# Patient Record
Sex: Female | Born: 1965 | Race: White | Hispanic: No | Marital: Married | Smoking: Never smoker
Health system: Southern US, Community
[De-identification: ages and names within clinical notes are randomized; demographics above are authoritative.]

## PROBLEM LIST (undated history)

## (undated) DIAGNOSIS — J45909 Unspecified asthma, uncomplicated: Secondary | ICD-10-CM

## (undated) DIAGNOSIS — L509 Urticaria, unspecified: Secondary | ICD-10-CM

## (undated) HISTORY — PX: TONSILLECTOMY: SUR1361

## (undated) HISTORY — DX: Unspecified asthma, uncomplicated: J45.909

## (undated) HISTORY — PX: ADENOIDECTOMY: SUR15

## (undated) HISTORY — PX: BREAST REDUCTION SURGERY: SHX8

## (undated) HISTORY — PX: LIPOSUCTION: SHX10

## (undated) HISTORY — DX: Urticaria, unspecified: L50.9

## (undated) HISTORY — PX: UMBILICAL HERNIA REPAIR: SHX196

---

## 1999-05-04 ENCOUNTER — Inpatient Hospital Stay (HOSPITAL_COMMUNITY): Admission: AD | Admit: 1999-05-04 | Discharge: 1999-05-07 | Payer: Self-pay | Admitting: Obstetrics and Gynecology

## 2000-12-29 ENCOUNTER — Other Ambulatory Visit: Admission: RE | Admit: 2000-12-29 | Discharge: 2000-12-29 | Payer: Self-pay | Admitting: Obstetrics and Gynecology

## 2001-01-08 ENCOUNTER — Ambulatory Visit (HOSPITAL_COMMUNITY): Admission: RE | Admit: 2001-01-08 | Discharge: 2001-01-08 | Payer: Self-pay | Admitting: Obstetrics and Gynecology

## 2001-01-08 ENCOUNTER — Encounter: Payer: Self-pay | Admitting: Obstetrics and Gynecology

## 2002-01-21 ENCOUNTER — Other Ambulatory Visit: Admission: RE | Admit: 2002-01-21 | Discharge: 2002-01-21 | Payer: Self-pay | Admitting: Obstetrics and Gynecology

## 2003-02-07 ENCOUNTER — Other Ambulatory Visit: Admission: RE | Admit: 2003-02-07 | Discharge: 2003-02-07 | Payer: Self-pay | Admitting: Obstetrics and Gynecology

## 2004-03-21 ENCOUNTER — Other Ambulatory Visit: Admission: RE | Admit: 2004-03-21 | Discharge: 2004-03-21 | Payer: Self-pay | Admitting: Obstetrics and Gynecology

## 2005-04-11 ENCOUNTER — Other Ambulatory Visit: Admission: RE | Admit: 2005-04-11 | Discharge: 2005-04-11 | Payer: Self-pay | Admitting: Obstetrics and Gynecology

## 2006-01-19 ENCOUNTER — Emergency Department (HOSPITAL_COMMUNITY): Admission: EM | Admit: 2006-01-19 | Discharge: 2006-01-19 | Payer: Self-pay | Admitting: Emergency Medicine

## 2006-01-28 ENCOUNTER — Ambulatory Visit: Payer: Self-pay | Admitting: Hematology & Oncology

## 2006-01-28 ENCOUNTER — Inpatient Hospital Stay (HOSPITAL_COMMUNITY): Admission: AD | Admit: 2006-01-28 | Discharge: 2006-01-30 | Payer: Self-pay | Admitting: Cardiology

## 2006-01-31 ENCOUNTER — Ambulatory Visit: Payer: Self-pay | Admitting: Hematology & Oncology

## 2006-02-03 LAB — PROTIME-INR
INR: 2.5 (ref 2.00–3.50)
Protime: 30 Seconds — ABNORMAL HIGH (ref 10.6–13.4)

## 2006-02-10 LAB — PROTIME-INR
INR: 3.1 (ref 2.00–3.50)
Protime: 37.2 Seconds — ABNORMAL HIGH (ref 10.6–13.4)

## 2006-02-17 LAB — PROTIME-INR
INR: 4.5 — ABNORMAL HIGH (ref 2.00–3.50)
Protime: 54 Seconds — ABNORMAL HIGH (ref 10.6–13.4)

## 2006-02-24 LAB — PROTIME-INR
INR: 1.6 — ABNORMAL LOW (ref 2.00–3.50)
Protime: 19.2 Seconds — ABNORMAL HIGH (ref 10.6–13.4)

## 2006-03-03 LAB — CBC WITH DIFFERENTIAL/PLATELET
Basophils Absolute: 0.1 10*3/uL (ref 0.0–0.1)
Eosinophils Absolute: 0.2 10*3/uL (ref 0.0–0.5)
HCT: 38.7 % (ref 34.8–46.6)
HGB: 13.8 g/dL (ref 11.6–15.9)
LYMPH%: 39.6 % (ref 14.0–48.0)
MCHC: 35.6 g/dL (ref 32.0–36.0)
MONO#: 0.5 10*3/uL (ref 0.1–0.9)
NEUT#: 3.1 10*3/uL (ref 1.5–6.5)
NEUT%: 48 % (ref 39.6–76.8)
Platelets: 247 10*3/uL (ref 145–400)
WBC: 6.4 10*3/uL (ref 3.9–10.0)

## 2006-03-03 LAB — PROTIME-INR

## 2006-03-13 LAB — PROTIME-INR
INR: 2.7 (ref 2.00–3.50)
Protime: 32.4 Seconds — ABNORMAL HIGH (ref 10.6–13.4)

## 2006-03-19 ENCOUNTER — Ambulatory Visit: Payer: Self-pay | Admitting: Hematology & Oncology

## 2006-03-24 LAB — PROTIME-INR
INR: 2 (ref 2.00–3.50)
Protime: 24 Seconds — ABNORMAL HIGH (ref 10.6–13.4)

## 2006-03-31 LAB — PROTIME-INR
INR: 1.9 — ABNORMAL LOW (ref 2.00–3.50)
Protime: 22.8 Seconds — ABNORMAL HIGH (ref 10.6–13.4)

## 2006-04-14 LAB — PROTIME-INR

## 2006-04-21 LAB — PROTIME-INR: Protime: 30 Seconds — ABNORMAL HIGH (ref 10.6–13.4)

## 2006-04-29 LAB — PROTIME-INR
INR: 2 (ref 2.00–3.50)
Protime: 24 Seconds — ABNORMAL HIGH (ref 10.6–13.4)

## 2006-04-30 ENCOUNTER — Ambulatory Visit: Payer: Self-pay | Admitting: Hematology & Oncology

## 2006-05-02 LAB — CBC WITH DIFFERENTIAL/PLATELET
Basophils Absolute: 0 10*3/uL (ref 0.0–0.1)
EOS%: 7.3 % — ABNORMAL HIGH (ref 0.0–7.0)
Eosinophils Absolute: 0.4 10*3/uL (ref 0.0–0.5)
HGB: 13.8 g/dL (ref 11.6–15.9)
MCH: 31.9 pg (ref 26.0–34.0)
NEUT#: 2.2 10*3/uL (ref 1.5–6.5)
RBC: 4.31 10*6/uL (ref 3.70–5.32)
RDW: 10.2 % — ABNORMAL LOW (ref 11.3–14.5)
lymph#: 2.2 10*3/uL (ref 0.9–3.3)

## 2006-05-02 LAB — PROTIME-INR
INR: 2 (ref 2.00–3.50)
Protime: 24 Seconds — ABNORMAL HIGH (ref 10.6–13.4)

## 2006-05-06 ENCOUNTER — Ambulatory Visit: Payer: Self-pay | Admitting: Vascular Surgery

## 2006-05-06 ENCOUNTER — Ambulatory Visit: Admission: RE | Admit: 2006-05-06 | Discharge: 2006-05-06 | Payer: Self-pay | Admitting: Hematology & Oncology

## 2006-05-12 LAB — PROTIME-INR

## 2006-05-26 LAB — PROTIME-INR
INR: 2.2 (ref 2.00–3.50)
Protime: 26.4 Seconds — ABNORMAL HIGH (ref 10.6–13.4)

## 2006-05-29 LAB — PROTIME-INR: Protime: 25.2 Seconds — ABNORMAL HIGH (ref 10.6–13.4)

## 2006-06-09 LAB — CBC WITH DIFFERENTIAL/PLATELET
BASO%: 0.7 % (ref 0.0–2.0)
EOS%: 3.8 % (ref 0.0–7.0)
LYMPH%: 24.7 % (ref 14.0–48.0)
MCHC: 35.3 g/dL (ref 32.0–36.0)
MCV: 88.8 fL (ref 81.0–101.0)
MONO%: 5.7 % (ref 0.0–13.0)
Platelets: 247 10*3/uL (ref 145–400)
RBC: 4.37 10*6/uL (ref 3.70–5.32)
WBC: 7.4 10*3/uL (ref 3.9–10.0)

## 2006-06-09 LAB — PROTIME-INR: Protime: 44.4 Seconds — ABNORMAL HIGH (ref 10.6–13.4)

## 2006-06-16 ENCOUNTER — Ambulatory Visit: Payer: Self-pay | Admitting: Hematology & Oncology

## 2006-06-23 LAB — PROTIME-INR: INR: 2.1 (ref 2.00–3.50)

## 2006-07-01 LAB — PROTIME-INR
INR: 2.4 (ref 2.00–3.50)
Protime: 28.8 Seconds — ABNORMAL HIGH (ref 10.6–13.4)

## 2006-07-07 LAB — PROTIME-INR
INR: 2.3 (ref 2.00–3.50)
Protime: 27.6 Seconds — ABNORMAL HIGH (ref 10.6–13.4)

## 2006-07-21 LAB — PROTIME-INR

## 2006-07-28 LAB — PROTIME-INR
INR: 2.6 (ref 2.00–3.50)
Protime: 31.2 Seconds — ABNORMAL HIGH (ref 10.6–13.4)

## 2006-08-07 ENCOUNTER — Ambulatory Visit: Payer: Self-pay | Admitting: Hematology & Oncology

## 2006-08-11 LAB — PROTIME-INR
INR: 2.8 (ref 2.00–3.50)
Protime: 33.6 Seconds — ABNORMAL HIGH (ref 10.6–13.4)

## 2006-08-25 LAB — PROTIME-INR
INR: 2.6 (ref 2.00–3.50)
Protime: 31.2 Seconds — ABNORMAL HIGH (ref 10.6–13.4)

## 2006-09-08 LAB — PROTIME-INR: INR: 2.8 (ref 2.00–3.50)

## 2006-09-18 ENCOUNTER — Ambulatory Visit: Payer: Self-pay | Admitting: Hematology & Oncology

## 2006-09-22 LAB — CBC WITH DIFFERENTIAL/PLATELET
BASO%: 0.9 % (ref 0.0–2.0)
Basophils Absolute: 0.1 10*3/uL (ref 0.0–0.1)
EOS%: 3.2 % (ref 0.0–7.0)
HCT: 41.1 % (ref 34.8–46.6)
HGB: 14.8 g/dL (ref 11.6–15.9)
MCH: 32.8 pg (ref 26.0–34.0)
MCHC: 36 g/dL (ref 32.0–36.0)
MCV: 91.3 fL (ref 81.0–101.0)
MONO%: 7 % (ref 0.0–13.0)
NEUT%: 45.5 % (ref 39.6–76.8)
RDW: 12.8 % (ref 11.3–14.5)

## 2006-10-13 LAB — PROTIME-INR

## 2006-11-02 ENCOUNTER — Ambulatory Visit: Payer: Self-pay | Admitting: Hematology & Oncology

## 2006-11-03 LAB — PROTIME-INR: Protime: 50.4 Seconds — ABNORMAL HIGH (ref 10.6–13.4)

## 2006-11-24 LAB — CBC WITH DIFFERENTIAL/PLATELET
BASO%: 0.6 % (ref 0.0–2.0)
Eosinophils Absolute: 0.2 10*3/uL (ref 0.0–0.5)
LYMPH%: 36.1 % (ref 14.0–48.0)
MCHC: 35.4 g/dL (ref 32.0–36.0)
MONO#: 0.4 10*3/uL (ref 0.1–0.9)
NEUT#: 3.4 10*3/uL (ref 1.5–6.5)
RBC: 4.48 10*6/uL (ref 3.70–5.32)
RDW: 10.3 % — ABNORMAL LOW (ref 11.3–14.5)
WBC: 6.2 10*3/uL (ref 3.9–10.0)
lymph#: 2.3 10*3/uL (ref 0.9–3.3)

## 2006-11-24 LAB — PROTIME-INR
INR: 3 (ref 2.00–3.50)
Protime: 36 Seconds — ABNORMAL HIGH (ref 10.6–13.4)

## 2006-12-19 ENCOUNTER — Ambulatory Visit: Payer: Self-pay | Admitting: Hematology & Oncology

## 2006-12-24 LAB — PROTIME-INR
INR: 3.1 (ref 2.00–3.50)
Protime: 37.2 Seconds — ABNORMAL HIGH (ref 10.6–13.4)

## 2007-01-26 LAB — CBC WITH DIFFERENTIAL/PLATELET
EOS%: 4.2 % (ref 0.0–7.0)
Eosinophils Absolute: 0.2 10*3/uL (ref 0.0–0.5)
LYMPH%: 39.6 % (ref 14.0–48.0)
MCH: 33.1 pg (ref 26.0–34.0)
MCV: 91.1 fL (ref 81.0–101.0)
MONO%: 6.3 % (ref 0.0–13.0)
NEUT#: 2.2 10*3/uL (ref 1.5–6.5)
Platelets: 230 10*3/uL (ref 145–400)
RBC: 4.16 10*6/uL (ref 3.70–5.32)
RDW: 12.4 % (ref 11.3–14.5)

## 2007-01-26 LAB — PROTIME-INR: Protime: 33.6 Seconds — ABNORMAL HIGH (ref 10.6–13.4)

## 2007-03-19 ENCOUNTER — Ambulatory Visit: Payer: Self-pay | Admitting: Hematology & Oncology

## 2010-07-27 NOTE — Consult Note (Signed)
[name], [name] NO.:  [id number]   MEDICAL RECORD NO.:  [id number]          PATIENT TYPE:  INP   LOCATION:  5711                         FACILITY:  [facility]   PHYSICIAN:  Stephani Police, PA    DATE OF BIRTH:  12/02/1965   DATE OF CONSULTATION:  DATE OF DISCHARGE:                                   CONSULTATION   HISTORY OF PRESENT ILLNESS:  Patient was admitted on January 27, 2006 and  had suffered a recent DVT on January 19, 2006.  She had been unable to  obtain therapeutic INR on Coumadin and was having pain in her lower  extremity.  LFTs on admission were AST 109 and ALT 134.  Patient currently  feels very well.  She denies abdominal pain, denies nausea, vomiting,  diarrhea, denies jaundice, rash, itch.  She denies any history of tattoo,  blood transfusion, or IV drug use.  She denies multiple sexual partners.  She denies any swelling or ascites, denies recent illness, denies recent  weight loss, denies any discoloration of her urine or stool.  She does tell  me that she recently stopped taking birth control pills that she has been on  for the past 15 years.   PAST MEDICAL HISTORY:  She reports that she has irritable bowel syndrome but  it is well controlled with diet, exercise, and stress management, GERD,  controlled with Nexium for the past 2-3 years, asthma, allergies.  She has a  history of palpitations.  She has an anal fissure and occasionally sees  bright red blood per rectum.  There is a minimal amount of streaking on the  toilet tissue when she notices it.  She had a lower extremity sprain 2-3  weeks ago.   SURGERIES:  Include an umbilical hernia repair with liposuction in May of  2007, tonsillectomy, C-section x2, breast reduction.   FAMILY HISTORY:  Is negative for GI cancers or liver disease.   SOCIAL HISTORY:  Shows no tobacco, no drugs, and approximately 1 glass of  wine per week.   REVIEW OF SYSTEMS:  Positive for headache, which the  patient attributes to  her sinus and allergies.   MEDICATIONS:  She was recently on birth control pills; Yaz specifically.  She was also on Mobic recently, but both of those have been discontinued.  Currently on Nexium, Coumadin, and Nasonex.   PHYSICAL EXAM:  GENERAL:  She is alert, oriented, in no apparent distress.  She is very pleasant to speak with.  CARDIOVASCULAR SYSTEM:  Has a regular rate and rhythm with no murmurs, rubs,  or gallops.  LUNGS:  Clear to auscultation bilaterally.  ABDOMEN:  Soft, nontender, nondistended with positive bowel sounds and no  apparent organomegaly.  SKIN:  Shows no rash or jaundice.  Her eyes have no scleral icterus.  EXTREMITIES:  Show no edema.   ASSESSMENT:  This patient was seen and examined by Dr. Wandalee Ferdinand.  His  assessment is that she is a 45 year old generally healthy female on DVT  (deep vein thrombosis) protocol.  There is no obvious cause for elevated  LFTs.  His recommendations are to check a hepatitis profile, check serum  iron and % saturations to look for hemochromatosis, check a ceruloplasmin  for Wilson's, have pharmacy check meds to see if any that she has been on  may cause an elevation in her LFTs.      Stephani Police, PA     MLY/MEDQ  D:  01/28/2006  T:  01/29/2006  Job:  [id number]   cc:   Ariana [name], M.D.  Ariana [name], M.D.

## 2010-07-27 NOTE — Op Note (Signed)
[facility]  Patient:    Ariana [name], Ariana [name]                    MRN: [id number] Proc. Date: 05/04/99 Adm. Date:  [id number] Attending:  Frederich Balding                           Operative Report  PREOPERATIVE DIAGNOSES:       Intrauterine pregnancy at term with prior cesarean section, desirous of repeat.  POSTOPERATIVE DIAGNOSES:      Intrauterine pregnancy at term with prior cesarean section, desirous of repeat.  OPERATIVE PROCEDURE:          Repeat low transverse cesarean section.  SURGEON:                      Juluis Mire, M.D.  ANESTHESIA:                   Spinal.  ESTIMATED BLOOD LOSS:         800 cc.  PACKS AND DRAINS:             None.  INTRAOPERATIVE BLOOD REPLACEMENT:  None.  COMPLICATIONS:                None.  INDICATIONS:                  Indications are as dictated in history and physical.  DESCRIPTION OF PROCEDURE:     Patient was taken to the OR and placed in supine position with left lateral tilt.  After satisfactory level of spinal anesthesia was obtained, the abdomen was prepped out with Betadine and draped as a sterile field. The prior low transverse skin incision was identified and excised.  The incision was extended through the subcutaneous tissue.  The anterior rectus fascia was entered sharply and incision in the fascia extended laterally.  Fascia was taken off the muscles superiorly and inferiorly.  Rectus muscles were separated in the midline.  The peritoneum was entered sharply and incision in the peritoneum extended both superiorly and inferiorly.  No adhesive process was noted.  A low  transverse bladder flap was developed.  A low transverse uterine incision was begun with a knife and extended laterally using manual traction.  Amniotic fluid was clear.  The infant presented in a vertex presentation and delivered with elevation of the head and fundal pressure; the infant was a viable female who  weighed 7 pounds 7 ounces.  Apgars were 8/9.  Umbilical artery pH was 7.32.  Placenta was then delivered manually.  The uterus was wiped free of remaining membranes and placenta. Uterus was closed with a running interlocking suture of 0 chromic using a two-layer-closure technique; we had good hemostasis.  Tubes and ovaries were visualized and noted to be unremarkable.  Pelvic cavity was irrigated; hemostasis was excellent.  Urine output clear.  Muscles were reapproximated with a running  suture of 3-0 Vicryl, fascia closed with a running suture of 0 PDS and skin was  closed with staples and Steri-Strips.  Sponge, instrument and needle count was reported as correct by the circulating nurse x 2.  Foley catheter remained clear at the time of closure.  Patient did tolerate the procedure well and was returned o the recovery room in good condition. DD:  05/04/99 TD:  05/05/99 Job: [id number]

## 2010-07-27 NOTE — Discharge Summary (Signed)
Ariana [name], MENTEL NO.:  [id number]   MEDICAL RECORD NO.:  [id number]          PATIENT TYPE:  INP   LOCATION:  5711                         FACILITY:  [facility]   PHYSICIAN:  Armanda Magic, M.D.     DATE OF BIRTH:  04/09/1965   DATE OF ADMISSION:  01/27/2006  DATE OF DISCHARGE:  01/30/2006                               DISCHARGE SUMMARY   DISCHARGE DIAGNOSES:  1. Extensive left lower extremity deep venous thrombosis extending      into the popliteal vein.  2. Longterm Coumadin therapy.  3. Transient elevation of liver function tests, idiopathic at this      time.  4. Gastroesophageal reflux disease.  5. Asthma.  6. Allergies.   Ariana [name] is a 45 year old female who was diagnosed with a left calf  DVT several days prior to her admission.  She was treated with b.i.d.  Lovenox and Coumadin.  She was only given 5 doses total of Lovenox and  then was told to follow up for a repeat INR.  She began having  increasing leg pain after 3 days to the point where she could not stand  and presented to our office on January 23, 2006.  Her INR was 1.4.  She  was then placed on Lovenox b.i.d., as well as Coumadin.  She represented  to the [facility]in Clinic about 3 days later, and her INR was  subtherapeutic at 1.1.  Her left lower extremity was just as painful as  before and the pain had radiated up into her thigh.  Lower extremity  venous Doppler should an extensive DVT into the popliteal vein.  She was  then admitted for heparin crossover.   By discharge, her INR was 2.7 and she was discharged on a Coumadin, no  Lovenox.  During her hospitalization, we did have a hematology consult  by Dr. Arlan Organ and, by discharge, her __________  workup was  negative.  Her Factor V was negative, lupus anticoagulant not detected.  Homocystine 7.6.   She was also noted to have transient elevation of her LFTs.  Her  hepatitis panel was negative.  Her labs showed her maximum ALT at  145,  maximum ALT at 114.  She was seen in consultation by the  gastroenterology service, Dr. Evette Cristal.  There was no obvious source for  elevated LFTs.  Her abdominal ultrasound was normal.  He felt the  patient could be seen and further worked up in the office.   By January 30, 2006, the patient was ready for discharge to home.   The patient is discharged home on the following medications:  1. Coumadin 7.5 mg a day.  2. Nexium 20 mg a day.  3. Zyrtec 10 mg a day.   Follow up with the Coumadin Clinic on Monday after discharge and then  followup on February 03, 2006 with Dr. Myna Hidalgo for blood work.      Guy Franco, P.A.      Armanda Magic, M.D.  Electronically Signed    LB/MEDQ  D:  03/05/2006  T:  03/06/2006  Job:  [id number]  cc:   Graylin Shiver, M.D.  Rose Phi. Myna Hidalgo, M.D.

## 2013-03-25 ENCOUNTER — Other Ambulatory Visit: Payer: Self-pay | Admitting: Obstetrics and Gynecology

## 2013-03-25 DIAGNOSIS — R922 Inconclusive mammogram: Secondary | ICD-10-CM

## 2013-03-25 DIAGNOSIS — Z803 Family history of malignant neoplasm of breast: Secondary | ICD-10-CM

## 2013-04-06 ENCOUNTER — Ambulatory Visit
Admission: RE | Admit: 2013-04-06 | Discharge: 2013-04-06 | Disposition: A | Discharge status: Home / Self Care | Payer: BC Managed Care – PPO | Source: Ambulatory Visit | Attending: Obstetrics and Gynecology | Admitting: Obstetrics and Gynecology

## 2013-04-06 DIAGNOSIS — R9230 Dense breasts, unspecified: Secondary | ICD-10-CM

## 2013-04-06 DIAGNOSIS — R922 Inconclusive mammogram: Secondary | ICD-10-CM

## 2013-04-06 DIAGNOSIS — Z803 Family history of malignant neoplasm of breast: Secondary | ICD-10-CM

## 2013-04-06 MED ORDER — GADOBENATE DIMEGLUMINE 529 MG/ML IV SOLN
10.0000 mL | Freq: Once | INTRAVENOUS | Status: AC | PRN
  Administered 2013-04-06: 10 mL via INTRAVENOUS

## 2013-04-08 ENCOUNTER — Other Ambulatory Visit (HOSPITAL_COMMUNITY): Payer: Self-pay | Admitting: Obstetrics and Gynecology

## 2013-04-08 DIAGNOSIS — K7689 Other specified diseases of liver: Secondary | ICD-10-CM

## 2013-04-14 ENCOUNTER — Ambulatory Visit (HOSPITAL_COMMUNITY)
Admission: RE | Admit: 2013-04-14 | Discharge: 2013-04-14 | Disposition: A | Discharge status: Home / Self Care | Payer: BC Managed Care – PPO | Source: Ambulatory Visit | Attending: Obstetrics and Gynecology | Admitting: Obstetrics and Gynecology

## 2013-04-14 ENCOUNTER — Other Ambulatory Visit (HOSPITAL_COMMUNITY): Payer: Self-pay | Admitting: Obstetrics and Gynecology

## 2013-04-14 DIAGNOSIS — K7689 Other specified diseases of liver: Secondary | ICD-10-CM

## 2014-03-31 ENCOUNTER — Telehealth: Payer: Self-pay | Admitting: *Deleted

## 2014-03-31 NOTE — Telephone Encounter (Signed)
Patient called stating that she has a warm, red lump that looked to her like a superficial clot along the vein path of a prior DVT. Patient has not been seen in this office for approximately 8 years. She was told to call her PCP for immediate evaluation, or to go to an Urgent Care/ED due to risk if this was indeed a clot. Also told her that if workup was positive for a clot, she could call the office back and request to be seen by Dr Myna HidalgoEnnever for management and observation. She understood the directions and stated she would follow up with her PCP today.

## 2014-04-21 ENCOUNTER — Other Ambulatory Visit: Payer: Self-pay | Admitting: Obstetrics and Gynecology

## 2014-04-21 DIAGNOSIS — Z9189 Other specified personal risk factors, not elsewhere classified: Secondary | ICD-10-CM

## 2014-04-21 DIAGNOSIS — Z803 Family history of malignant neoplasm of breast: Secondary | ICD-10-CM

## 2014-05-30 ENCOUNTER — Ambulatory Visit
Admission: RE | Admit: 2014-05-30 | Discharge: 2014-05-30 | Disposition: A | Discharge status: Home / Self Care | Payer: BLUE CROSS/BLUE SHIELD | Source: Ambulatory Visit | Attending: Obstetrics and Gynecology | Admitting: Obstetrics and Gynecology

## 2014-05-30 DIAGNOSIS — Z9189 Other specified personal risk factors, not elsewhere classified: Secondary | ICD-10-CM

## 2014-05-30 DIAGNOSIS — Z803 Family history of malignant neoplasm of breast: Secondary | ICD-10-CM

## 2014-05-30 MED ORDER — GADOBENATE DIMEGLUMINE 529 MG/ML IV SOLN
12.0000 mL | Freq: Once | INTRAVENOUS | Status: AC | PRN
  Administered 2014-05-30: 12 mL via INTRAVENOUS

## 2014-11-19 DIAGNOSIS — J45909 Unspecified asthma, uncomplicated: Secondary | ICD-10-CM | POA: Insufficient documentation

## 2014-11-19 DIAGNOSIS — H1010 Acute atopic conjunctivitis, unspecified eye: Secondary | ICD-10-CM

## 2014-11-19 DIAGNOSIS — J309 Allergic rhinitis, unspecified: Principal | ICD-10-CM

## 2014-12-15 ENCOUNTER — Ambulatory Visit (INDEPENDENT_AMBULATORY_CARE_PROVIDER_SITE_OTHER): Payer: BLUE CROSS/BLUE SHIELD

## 2014-12-15 DIAGNOSIS — H1010 Acute atopic conjunctivitis, unspecified eye: Secondary | ICD-10-CM | POA: Diagnosis not present

## 2014-12-15 DIAGNOSIS — J309 Allergic rhinitis, unspecified: Secondary | ICD-10-CM

## 2014-12-26 ENCOUNTER — Other Ambulatory Visit: Payer: Self-pay | Admitting: Neurology

## 2014-12-26 ENCOUNTER — Ambulatory Visit (INDEPENDENT_AMBULATORY_CARE_PROVIDER_SITE_OTHER): Payer: BLUE CROSS/BLUE SHIELD | Admitting: Neurology

## 2014-12-26 DIAGNOSIS — J309 Allergic rhinitis, unspecified: Secondary | ICD-10-CM

## 2014-12-27 ENCOUNTER — Other Ambulatory Visit: Payer: Self-pay | Admitting: Neurology

## 2014-12-27 NOTE — Telephone Encounter (Signed)
Patient came in for allergy injection 12/26/14 and ask me if she could get a refill on her flovent. After pulling patients chart and speaking with Dr Willa RoughHicks, called in flovent 110 to [facility] [address] and added one refill. Patient will follow up with Dr Willa RoughHicks sometime in the next month or so.

## 2015-01-05 ENCOUNTER — Ambulatory Visit (INDEPENDENT_AMBULATORY_CARE_PROVIDER_SITE_OTHER): Payer: BLUE CROSS/BLUE SHIELD | Admitting: Neurology

## 2015-01-05 DIAGNOSIS — J309 Allergic rhinitis, unspecified: Secondary | ICD-10-CM | POA: Diagnosis not present

## 2015-01-12 ENCOUNTER — Ambulatory Visit (INDEPENDENT_AMBULATORY_CARE_PROVIDER_SITE_OTHER): Payer: BLUE CROSS/BLUE SHIELD

## 2015-01-12 DIAGNOSIS — J309 Allergic rhinitis, unspecified: Secondary | ICD-10-CM | POA: Diagnosis not present

## 2015-01-19 ENCOUNTER — Ambulatory Visit (INDEPENDENT_AMBULATORY_CARE_PROVIDER_SITE_OTHER): Payer: BLUE CROSS/BLUE SHIELD

## 2015-01-19 DIAGNOSIS — J309 Allergic rhinitis, unspecified: Secondary | ICD-10-CM

## 2015-01-31 ENCOUNTER — Ambulatory Visit (INDEPENDENT_AMBULATORY_CARE_PROVIDER_SITE_OTHER): Payer: BLUE CROSS/BLUE SHIELD | Admitting: *Deleted

## 2015-01-31 DIAGNOSIS — J309 Allergic rhinitis, unspecified: Secondary | ICD-10-CM | POA: Diagnosis not present

## 2015-02-09 ENCOUNTER — Ambulatory Visit (INDEPENDENT_AMBULATORY_CARE_PROVIDER_SITE_OTHER): Payer: BLUE CROSS/BLUE SHIELD

## 2015-02-09 DIAGNOSIS — J309 Allergic rhinitis, unspecified: Secondary | ICD-10-CM | POA: Diagnosis not present

## 2015-02-28 ENCOUNTER — Ambulatory Visit (INDEPENDENT_AMBULATORY_CARE_PROVIDER_SITE_OTHER): Payer: BLUE CROSS/BLUE SHIELD

## 2015-02-28 DIAGNOSIS — J309 Allergic rhinitis, unspecified: Secondary | ICD-10-CM | POA: Diagnosis not present

## 2015-03-09 ENCOUNTER — Ambulatory Visit (INDEPENDENT_AMBULATORY_CARE_PROVIDER_SITE_OTHER): Payer: BLUE CROSS/BLUE SHIELD

## 2015-03-09 DIAGNOSIS — J309 Allergic rhinitis, unspecified: Secondary | ICD-10-CM | POA: Diagnosis not present

## 2015-03-16 ENCOUNTER — Ambulatory Visit (INDEPENDENT_AMBULATORY_CARE_PROVIDER_SITE_OTHER): Payer: BLUE CROSS/BLUE SHIELD

## 2015-03-16 DIAGNOSIS — J309 Allergic rhinitis, unspecified: Secondary | ICD-10-CM | POA: Diagnosis not present

## 2015-03-22 ENCOUNTER — Ambulatory Visit (INDEPENDENT_AMBULATORY_CARE_PROVIDER_SITE_OTHER): Payer: BLUE CROSS/BLUE SHIELD

## 2015-03-22 DIAGNOSIS — J309 Allergic rhinitis, unspecified: Secondary | ICD-10-CM

## 2015-03-28 ENCOUNTER — Other Ambulatory Visit: Payer: Self-pay | Admitting: Obstetrics and Gynecology

## 2015-03-28 DIAGNOSIS — Z803 Family history of malignant neoplasm of breast: Secondary | ICD-10-CM

## 2015-03-30 ENCOUNTER — Ambulatory Visit (INDEPENDENT_AMBULATORY_CARE_PROVIDER_SITE_OTHER): Payer: BLUE CROSS/BLUE SHIELD

## 2015-03-30 DIAGNOSIS — J309 Allergic rhinitis, unspecified: Secondary | ICD-10-CM | POA: Diagnosis not present

## 2015-04-05 ENCOUNTER — Other Ambulatory Visit: Payer: BLUE CROSS/BLUE SHIELD

## 2015-04-08 ENCOUNTER — Other Ambulatory Visit: Payer: Self-pay | Admitting: Allergy and Immunology

## 2015-04-13 ENCOUNTER — Ambulatory Visit (INDEPENDENT_AMBULATORY_CARE_PROVIDER_SITE_OTHER): Payer: BLUE CROSS/BLUE SHIELD

## 2015-04-13 DIAGNOSIS — J309 Allergic rhinitis, unspecified: Secondary | ICD-10-CM

## 2015-04-21 ENCOUNTER — Ambulatory Visit (INDEPENDENT_AMBULATORY_CARE_PROVIDER_SITE_OTHER): Payer: BLUE CROSS/BLUE SHIELD | Admitting: Neurology

## 2015-04-21 DIAGNOSIS — J309 Allergic rhinitis, unspecified: Secondary | ICD-10-CM

## 2015-05-03 ENCOUNTER — Ambulatory Visit (INDEPENDENT_AMBULATORY_CARE_PROVIDER_SITE_OTHER): Payer: BLUE CROSS/BLUE SHIELD

## 2015-05-03 DIAGNOSIS — J309 Allergic rhinitis, unspecified: Secondary | ICD-10-CM

## 2015-05-10 ENCOUNTER — Ambulatory Visit (INDEPENDENT_AMBULATORY_CARE_PROVIDER_SITE_OTHER): Payer: BLUE CROSS/BLUE SHIELD

## 2015-05-10 DIAGNOSIS — J309 Allergic rhinitis, unspecified: Secondary | ICD-10-CM

## 2015-05-19 ENCOUNTER — Ambulatory Visit (INDEPENDENT_AMBULATORY_CARE_PROVIDER_SITE_OTHER): Payer: BLUE CROSS/BLUE SHIELD | Admitting: *Deleted

## 2015-05-19 DIAGNOSIS — J309 Allergic rhinitis, unspecified: Secondary | ICD-10-CM | POA: Diagnosis not present

## 2015-05-26 ENCOUNTER — Ambulatory Visit (INDEPENDENT_AMBULATORY_CARE_PROVIDER_SITE_OTHER): Payer: BLUE CROSS/BLUE SHIELD | Admitting: *Deleted

## 2015-05-26 DIAGNOSIS — J309 Allergic rhinitis, unspecified: Secondary | ICD-10-CM | POA: Diagnosis not present

## 2015-06-05 ENCOUNTER — Other Ambulatory Visit: Payer: Self-pay | Admitting: Allergy and Immunology

## 2015-06-05 ENCOUNTER — Ambulatory Visit
Admission: RE | Admit: 2015-06-05 | Discharge: 2015-06-05 | Disposition: A | Discharge status: Home / Self Care | Payer: BLUE CROSS/BLUE SHIELD | Source: Ambulatory Visit | Attending: Obstetrics and Gynecology | Admitting: Obstetrics and Gynecology

## 2015-06-05 DIAGNOSIS — Z803 Family history of malignant neoplasm of breast: Secondary | ICD-10-CM

## 2015-06-05 MED ORDER — GADOBENATE DIMEGLUMINE 529 MG/ML IV SOLN
12.0000 mL | Freq: Once | INTRAVENOUS | Status: AC | PRN
  Administered 2015-06-05: 12 mL via INTRAVENOUS

## 2015-06-06 ENCOUNTER — Telehealth: Payer: Self-pay | Admitting: *Deleted

## 2015-06-06 ENCOUNTER — Ambulatory Visit (INDEPENDENT_AMBULATORY_CARE_PROVIDER_SITE_OTHER): Payer: BLUE CROSS/BLUE SHIELD | Admitting: *Deleted

## 2015-06-06 DIAGNOSIS — J309 Allergic rhinitis, unspecified: Secondary | ICD-10-CM | POA: Diagnosis not present

## 2015-06-06 NOTE — Telephone Encounter (Signed)
error 

## 2015-06-15 ENCOUNTER — Ambulatory Visit (INDEPENDENT_AMBULATORY_CARE_PROVIDER_SITE_OTHER): Payer: BLUE CROSS/BLUE SHIELD

## 2015-06-15 DIAGNOSIS — J309 Allergic rhinitis, unspecified: Secondary | ICD-10-CM | POA: Diagnosis not present

## 2015-06-20 ENCOUNTER — Ambulatory Visit (INDEPENDENT_AMBULATORY_CARE_PROVIDER_SITE_OTHER): Payer: BLUE CROSS/BLUE SHIELD | Admitting: *Deleted

## 2015-06-20 DIAGNOSIS — J309 Allergic rhinitis, unspecified: Secondary | ICD-10-CM | POA: Diagnosis not present

## 2015-06-21 DIAGNOSIS — J3089 Other allergic rhinitis: Secondary | ICD-10-CM | POA: Diagnosis not present

## 2015-06-28 ENCOUNTER — Ambulatory Visit (INDEPENDENT_AMBULATORY_CARE_PROVIDER_SITE_OTHER): Payer: BLUE CROSS/BLUE SHIELD

## 2015-06-28 DIAGNOSIS — J309 Allergic rhinitis, unspecified: Secondary | ICD-10-CM | POA: Diagnosis not present

## 2015-07-14 ENCOUNTER — Ambulatory Visit (INDEPENDENT_AMBULATORY_CARE_PROVIDER_SITE_OTHER): Payer: BLUE CROSS/BLUE SHIELD | Admitting: Allergy and Immunology

## 2015-07-14 ENCOUNTER — Encounter: Payer: Self-pay | Admitting: Allergy and Immunology

## 2015-07-14 VITALS — BP 108/68 | HR 70 | Resp 16 | Ht 60.63 in | Wt 133.4 lb

## 2015-07-14 DIAGNOSIS — J4531 Mild persistent asthma with (acute) exacerbation: Secondary | ICD-10-CM | POA: Diagnosis not present

## 2015-07-14 DIAGNOSIS — R059 Cough, unspecified: Secondary | ICD-10-CM

## 2015-07-14 DIAGNOSIS — R05 Cough: Secondary | ICD-10-CM

## 2015-07-14 DIAGNOSIS — H1010 Acute atopic conjunctivitis, unspecified eye: Secondary | ICD-10-CM

## 2015-07-14 DIAGNOSIS — J309 Allergic rhinitis, unspecified: Secondary | ICD-10-CM | POA: Diagnosis not present

## 2015-07-14 MED ORDER — ALBUTEROL SULFATE HFA 108 (90 BASE) MCG/ACT IN AERS: 2.0000 | INHALATION_SPRAY | RESPIRATORY_TRACT | Status: DC | PRN

## 2015-07-14 MED ORDER — EPINEPHRINE 0.3 MG/0.3ML IJ SOAJ: 0.3000 mg | Freq: Once | INTRAMUSCULAR | Status: DC

## 2015-07-14 NOTE — Progress Notes (Signed)
FOLLOW UP NOTE  RE: Ariana MaywoodClaire H Doepke MRN: [id number] DOB: 01/17/1966 [facility] [place] [address] [place] [id number] Date of Office Visit: 07/14/2015  Subjective:  Ariana Green is a 50 y.o. female who presents today for Cough  Assessment:   1. Cough, afebrile in no respiratory distress.    2. Mild persistent asthma, with acute exacerbation.   3. Allergic rhinoconjunctivitis, on immunotherapy.    Plan:   Meds ordered this encounter  Medications  . EPINEPHrine (EPIPEN 2-PAK) 0.3 mg/0.3 mL IJ SOAJ injection    Sig: Inject 0.3 mLs (0.3 mg total) into the muscle once.    Dispense:  2 Device    Refill:  1  . albuterol (PROAIR HFA) 108 (90 Base) MCG/ACT inhaler    Sig: Inhale 2 puffs into the lungs every 4 (four) hours as needed for wheezing or shortness of breath.    Dispense:  1 Inhaler    Refill:  1  1.  Ariana Green will complete Prednisone 30 mg today, 20 mg once daily for 2 days and 10 mg last day. 2.  Continue Flovent/Singulair/Allegra daily, (Qvar 80 sample given to use until empty in place of Flovent). 3.  Saline nasal lavage each evening at bath/shower time. 4.  Ventolin HFA 2 puffs every 4 hours as needed for cough or wheeze. 5.  Restart allergy injections in one week. 6.  EpiPen/Benadryl as needed. 7.  Follow-up in 4-6 months or sooner if needed.  HPI: Ariana Green returns to the office with cough over the last week.  She had been doing very well since her last visit in July, maintaining on preventive regime and receiving immunotherapy without large local or systemic reactions.  Since her symptoms began with the fluctuant weather pattern, she noted increasing rhinorrhea, congestion, postnasal drip with throat clearing, and, therefore, started albuterol and increased her Flonase, Flovent and Nexium.  She traveled to the beach and noted an improvement in her symptoms, but now worse since returned to [place].  Has additional travel coming  in the next 2 weeks and hopes to be symptom-free.  She denies any sick contacts, difficulty in breathing, shortness of breath, chest tightness, discolored drainage, headache, fever, or sore throat.  She reports Flovent is better than Asmanex, but not as beneficial Qvar.  Denies ED or urgent care visits, prednisone or antibiotic courses. Reports sleep and activity are normal.  Ariana Green has a current medication list which includes the following prescription(s): albuterol, amitriptyline, epinephrine, esomeprazole, fexofenadine, fish oil, flovent hfa, fluticasone, montelukast, multivitamin, pseudoephedrine-ibuprofen, sulfamethoxazole-trimethoprim, Tumeric.   Drug Allergies: Allergies  Allergen Reactions  . Doxycycline    Objective:   Filed Vitals:   07/14/15 1438  BP: 108/68  Pulse: 70  Resp: 16   SpO2 Readings from Last 1 Encounters:  07/14/15 97%   Physical Exam  Constitutional: She is well-developed, well-nourished, and in no distress.  HENT:  Head: Atraumatic.  Right Ear: Tympanic membrane and ear canal normal.  Left Ear: Tympanic membrane and ear canal normal.  Nose: Mucosal edema present. No rhinorrhea. No epistaxis.  Mouth/Throat: Oropharynx is clear and moist and mucous membranes are normal. No oropharyngeal exudate, posterior oropharyngeal edema or posterior oropharyngeal erythema.  Neck: Neck supple.  Cardiovascular: Normal rate, S1 normal and S2 normal.   No murmur heard. Pulmonary/Chest: Effort normal. She has no wheezes. She has no rhonchi. She has no rales.  Post Xopenex/Atrovent neb: Continues to be clear.  Patient without wheeze, rhonchi or  crackles.  Patient reports improved.  Lymphadenopathy:    She has no cervical adenopathy.   Diagnostics: Spirometry:  FVC 3.55--129%, FEV1 2.78--120%; minimal-change postbronchodilator--see scanned image.      Reylene Stauder M. Willa Rough, MD  cc: Juluis Mire, MD

## 2015-07-14 NOTE — Patient Instructions (Addendum)
   Prednisone 30 mg today, 20 mg once daily for 2 days and 10 mg last day.  Continue Flovent, Singulair and Allegra daily.  Saline nasal lavage each evening at bath/shower time.  Ventolin HFA 2 puffs every 4 as needed for cough or wheeze.  Restart allergy injections in one week.  EpiPen/Benadryl as needed.  Follow-up in 4-6 months or sooner if needed.

## 2015-07-25 ENCOUNTER — Ambulatory Visit (INDEPENDENT_AMBULATORY_CARE_PROVIDER_SITE_OTHER): Payer: BLUE CROSS/BLUE SHIELD

## 2015-07-25 DIAGNOSIS — J309 Allergic rhinitis, unspecified: Secondary | ICD-10-CM

## 2015-08-10 DIAGNOSIS — Z6825 Body mass index (BMI) 25.0-25.9, adult: Secondary | ICD-10-CM | POA: Diagnosis not present

## 2015-08-10 DIAGNOSIS — Z01419 Encounter for gynecological examination (general) (routine) without abnormal findings: Secondary | ICD-10-CM | POA: Diagnosis not present

## 2015-08-11 ENCOUNTER — Ambulatory Visit (INDEPENDENT_AMBULATORY_CARE_PROVIDER_SITE_OTHER): Payer: BLUE CROSS/BLUE SHIELD | Admitting: *Deleted

## 2015-08-11 DIAGNOSIS — J309 Allergic rhinitis, unspecified: Secondary | ICD-10-CM | POA: Diagnosis not present

## 2015-08-15 ENCOUNTER — Telehealth: Payer: Self-pay | Admitting: Allergy and Immunology

## 2015-08-15 NOTE — Telephone Encounter (Signed)
She was in maybe a month ago and she has been taking the prescriptions that Dr. Willa RoughHicks gave her along with some things that she had at home. She did get a little relief with the added medications however she is still having a lot of post nasal drip in the afternoon. She has been taking 2 Claratin and her Zyrtec but is still having problems. Is there something different she can try?  [facility] [address]

## 2015-08-16 NOTE — Telephone Encounter (Signed)
Phone call to patient only received voicemail, left message regarding possible options and necessity to discontinue the multiple oral antihistamines..  Will Call back again.

## 2015-08-16 NOTE — Telephone Encounter (Signed)
Additional call-Left message on voicemail. Will try again.

## 2015-08-18 NOTE — Telephone Encounter (Addendum)
Phone call to patient-- left message on voicemail.  Call to second number listed.  Patient will change eye drop to Zaditor instead of decongestant drop and continue QVAR, Xyzal and Rhinocort as doing much better.  Pt will call back with further concerns.

## 2015-08-18 NOTE — Telephone Encounter (Signed)
Patient is calling to let Dr. Willa RoughHicks know she switched to Xyzal, rhinocort 2x a day 2 sprays and also Opcon A eye drops 2 days ago. She also is continuing her qvar 80 as prescribed.  She is feeling much better since the switch.   Please Advise  Thanks

## 2015-08-21 ENCOUNTER — Ambulatory Visit (INDEPENDENT_AMBULATORY_CARE_PROVIDER_SITE_OTHER): Payer: BLUE CROSS/BLUE SHIELD

## 2015-08-21 DIAGNOSIS — J309 Allergic rhinitis, unspecified: Secondary | ICD-10-CM | POA: Diagnosis not present

## 2015-08-25 DIAGNOSIS — L821 Other seborrheic keratosis: Secondary | ICD-10-CM | POA: Diagnosis not present

## 2015-08-25 DIAGNOSIS — D1801 Hemangioma of skin and subcutaneous tissue: Secondary | ICD-10-CM | POA: Diagnosis not present

## 2015-08-25 DIAGNOSIS — L718 Other rosacea: Secondary | ICD-10-CM | POA: Diagnosis not present

## 2015-08-25 DIAGNOSIS — B370 Candidal stomatitis: Secondary | ICD-10-CM | POA: Diagnosis not present

## 2015-08-29 ENCOUNTER — Ambulatory Visit (INDEPENDENT_AMBULATORY_CARE_PROVIDER_SITE_OTHER): Payer: BLUE CROSS/BLUE SHIELD | Admitting: *Deleted

## 2015-08-29 DIAGNOSIS — J309 Allergic rhinitis, unspecified: Secondary | ICD-10-CM | POA: Diagnosis not present

## 2015-09-14 ENCOUNTER — Other Ambulatory Visit: Payer: Self-pay | Admitting: Allergy and Immunology

## 2015-09-15 ENCOUNTER — Ambulatory Visit (INDEPENDENT_AMBULATORY_CARE_PROVIDER_SITE_OTHER): Payer: BLUE CROSS/BLUE SHIELD | Admitting: *Deleted

## 2015-09-15 DIAGNOSIS — J309 Allergic rhinitis, unspecified: Secondary | ICD-10-CM

## 2015-09-18 ENCOUNTER — Ambulatory Visit (INDEPENDENT_AMBULATORY_CARE_PROVIDER_SITE_OTHER): Payer: BLUE CROSS/BLUE SHIELD | Admitting: *Deleted

## 2015-09-18 DIAGNOSIS — J309 Allergic rhinitis, unspecified: Secondary | ICD-10-CM | POA: Diagnosis not present

## 2015-09-25 ENCOUNTER — Ambulatory Visit (INDEPENDENT_AMBULATORY_CARE_PROVIDER_SITE_OTHER): Payer: BLUE CROSS/BLUE SHIELD | Admitting: *Deleted

## 2015-09-25 DIAGNOSIS — J309 Allergic rhinitis, unspecified: Secondary | ICD-10-CM

## 2015-10-04 ENCOUNTER — Ambulatory Visit (INDEPENDENT_AMBULATORY_CARE_PROVIDER_SITE_OTHER): Payer: BLUE CROSS/BLUE SHIELD | Admitting: *Deleted

## 2015-10-04 DIAGNOSIS — J309 Allergic rhinitis, unspecified: Secondary | ICD-10-CM

## 2015-10-10 ENCOUNTER — Ambulatory Visit (INDEPENDENT_AMBULATORY_CARE_PROVIDER_SITE_OTHER): Payer: BLUE CROSS/BLUE SHIELD

## 2015-10-10 DIAGNOSIS — J309 Allergic rhinitis, unspecified: Secondary | ICD-10-CM

## 2015-11-01 ENCOUNTER — Ambulatory Visit (INDEPENDENT_AMBULATORY_CARE_PROVIDER_SITE_OTHER): Payer: BLUE CROSS/BLUE SHIELD | Admitting: *Deleted

## 2015-11-01 DIAGNOSIS — J309 Allergic rhinitis, unspecified: Secondary | ICD-10-CM

## 2015-11-08 ENCOUNTER — Ambulatory Visit (INDEPENDENT_AMBULATORY_CARE_PROVIDER_SITE_OTHER): Payer: BLUE CROSS/BLUE SHIELD | Admitting: *Deleted

## 2015-11-08 DIAGNOSIS — J309 Allergic rhinitis, unspecified: Secondary | ICD-10-CM | POA: Diagnosis not present

## 2015-11-09 ENCOUNTER — Other Ambulatory Visit: Payer: Self-pay | Admitting: *Deleted

## 2015-11-09 MED ORDER — MONTELUKAST SODIUM 10 MG PO TABS: 10.0000 mg | ORAL_TABLET | Freq: Every day | ORAL | 3 refills | Status: DC

## 2015-11-15 ENCOUNTER — Ambulatory Visit (INDEPENDENT_AMBULATORY_CARE_PROVIDER_SITE_OTHER): Payer: BLUE CROSS/BLUE SHIELD

## 2015-11-15 DIAGNOSIS — J309 Allergic rhinitis, unspecified: Secondary | ICD-10-CM

## 2015-11-27 ENCOUNTER — Ambulatory Visit (INDEPENDENT_AMBULATORY_CARE_PROVIDER_SITE_OTHER): Payer: BLUE CROSS/BLUE SHIELD

## 2015-11-27 DIAGNOSIS — J309 Allergic rhinitis, unspecified: Secondary | ICD-10-CM | POA: Diagnosis not present

## 2015-12-08 ENCOUNTER — Ambulatory Visit (INDEPENDENT_AMBULATORY_CARE_PROVIDER_SITE_OTHER): Payer: BLUE CROSS/BLUE SHIELD

## 2015-12-08 DIAGNOSIS — J309 Allergic rhinitis, unspecified: Secondary | ICD-10-CM

## 2015-12-14 ENCOUNTER — Ambulatory Visit (INDEPENDENT_AMBULATORY_CARE_PROVIDER_SITE_OTHER): Payer: BLUE CROSS/BLUE SHIELD | Admitting: *Deleted

## 2015-12-14 DIAGNOSIS — J309 Allergic rhinitis, unspecified: Secondary | ICD-10-CM

## 2015-12-14 DIAGNOSIS — H1010 Acute atopic conjunctivitis, unspecified eye: Secondary | ICD-10-CM | POA: Diagnosis not present

## 2015-12-18 ENCOUNTER — Ambulatory Visit (INDEPENDENT_AMBULATORY_CARE_PROVIDER_SITE_OTHER): Payer: BLUE CROSS/BLUE SHIELD | Admitting: Allergy & Immunology

## 2015-12-18 ENCOUNTER — Encounter: Payer: Self-pay | Admitting: Allergy & Immunology

## 2015-12-18 ENCOUNTER — Ambulatory Visit (INDEPENDENT_AMBULATORY_CARE_PROVIDER_SITE_OTHER): Payer: BLUE CROSS/BLUE SHIELD | Admitting: *Deleted

## 2015-12-18 VITALS — BP 120/72 | HR 67 | Temp 98.7°F

## 2015-12-18 DIAGNOSIS — H1010 Acute atopic conjunctivitis, unspecified eye: Secondary | ICD-10-CM

## 2015-12-18 DIAGNOSIS — J4530 Mild persistent asthma, uncomplicated: Secondary | ICD-10-CM

## 2015-12-18 DIAGNOSIS — J309 Allergic rhinitis, unspecified: Secondary | ICD-10-CM

## 2015-12-18 DIAGNOSIS — B370 Candidal stomatitis: Secondary | ICD-10-CM | POA: Diagnosis not present

## 2015-12-18 MED ORDER — FLUCONAZOLE 100 MG PO TABS: 100.0000 mg | ORAL_TABLET | Freq: Every day | ORAL | 0 refills | Status: AC

## 2015-12-18 NOTE — Progress Notes (Signed)
FOLLOW UP  Date of Service/Encounter:  12/18/15   Assessment:   Mild persistent asthma, uncomplicated  Allergic rhinoconjunctivitis  Thrush   Asthma Reportables:  Severity: mild persistent  Risk: low Control: not well controlled  Seasonal Influenza Vaccine: yes    Plan/Recommendations:   1. Allergic rhinoconjunctivitis - Continue Xyzal 1/2 tablet daily. - Continue with Rhinocort. - Continue with Singulair. - We will start to advance treatment on your allergy shots.  2. Mild persistent asthma, uncomplicated - Use a spacer with your Qvar.  - Breathing test normal today, but this can change quickly especially since she has not been on her maintenance medication. - Continue with Singulair.  - Xolair was discussed as a means of both controlling her asthma and allowing her to tolerate increased doses of her allergy shots, but we will hold off for now. - She has never had chronic urticaria, therefore we would have to obtain a serum IgE level before proceeding with Xolair. - She also has problems with her insurance company approving even inhaled steroids, so I am unsure how well the Xolair approval process will take or if it would be approved.   3. Thrush - Start Diflucan 100mg  once daily for 14 days. - If you are not feeling better in a few days, call Dr. Madilyn Fireman for a follow up appointment.  4. Return in about 2 months (around 02/17/2016).      Subjective:   Ariana Green is a 50 y.o. female presenting today for follow up of  Chief Complaint  Patient presents with  . Follow-up    discuss allergy injections. Questioning thrush, had it a few months ago.   Ariana Green has a history of the following: Patient Active Problem List   Diagnosis Date Noted  . Allergic rhinoconjunctivitis 11/19/2014  . Asthma 11/19/2014    History obtained from: chart review and patient.  Ariana Green was referred by Ariana Mire, MD.     Ariana Green is a 50 y.o.  female presenting for a follow up visit for asthma and allergies. Ariana Green was last seen in May 2017 by Dr. Willa Rough, who has since left this practice. At that time, she was continued on Flovent, Singulair, and Allegra daily. She was planning to restart her allergy injections. Due to the large local reactions, her shots were frozen in July 2017 at the red vial 0.2 mL for 3 months. The plan was to discuss advancing again after 3 months. She was also started on a prednisone burst for an asthma exacerbation. She was having some issues with increased nasal congestion in June and was changed to Xyzal, Rhinocort, and Opcon-A eye drops. Her Flovent was switched to Qvar due to increased efficacy, although she was having insurance problems finishing the medication covered.   Since that time, she has been somewhat stable although she has had increased problems. She reports that she had thrush in her mouth as well as her esophagus in early June. This was not diagnosed with endoscopy but rather the oral thrush was seen and her primary care physician treated her with one week of fluconazole with marked improvement. She did fairly well through the summer once her allergic rhinitis medications were changed, but approximately 1-2 weeks ago she began having pains in the suprapubic area as well as midline chest. She feels that she now has thrush in her esophagus. Over the last 2-3 days she has noticed white plaques in her mouth. Because of the thrush, she has  stopped using her Qvar. Of note, she has never used a spacer. Her breathing has not changed since being off of the Qvar, but she would like to take care of things before her breathing worsens.  Ariana Green does have a history of allergic rhinitis. She is currently on Xyzal one half of a tablet daily as well as Rhinocort. In general, she does not like the nasal steroids but she has been able to tolerate Rhinocort. An entire Xyza tablet results in a feeling of xerostomia. She is  on allergy injections and currently receives 0.2 mL of the red vial. This dose is been frozen for 3 months due to large local reactions. She has been on immunotherapy for approximately 2-1/2 years with minimal improvement in her symptoms, although she has had difficulty advancing her dose.   Ariana Green has had asthma for her entire life. It was fairly well controlled with when necessary use of rescue inhalers until about 8-9 years ago when she contracted whooping cough from her son. Since then, her medications have slowly been increased. She was initially on Qvar which did help, then her insurance stopped covering it. She then was on Asmanex which resulted in increased symptoms. She was also on Flovent in the past, which worked but did not provide her much wiggle room in case she forgot to take her medication. She has always felt the best control on Qvar. She has never been on a combined ICS/LABA. She does not have a history of hives. She is on Singulair and has been for several years.   Ariana Green does have a history of reflux and is on Nexium 20-40 mg daily. She does see a gastroenterologist but has not been to follow-up in quite some time (Dr. Madilyn Fireman). She does not remember having an endoscopy. She denies feeling that food gets stuck in her mouth with eating.   Ariana Green does not have a history of infections other than the thrush. She has had no viral infections, bacterial infections, or other fungal infection. She does not remember the last time that she needed antibiotics. Otherwise, there have been no changes to the past medical history, surgical history, family history, or social history.     Review of Systems: a 14-point review of systems is pertinent for what is mentioned in HPI.  Otherwise, all other systems were negative. Constitutional: negative other than that listed in the HPI Eyes: negative other than that listed in the HPI Ears, nose, mouth, throat, and face: negative other than  that listed in the HPI Respiratory: negative other than that listed in the HPI Cardiovascular: negative other than that listed in the HPI Gastrointestinal: negative other than that listed in the HPI Genitourinary: positive for vaginal yeast (increasing due to menopause per patient), otherwise negative other than that listed in the HPI Integument: negative other than that listed in the HPI Hematologic: negative other than that listed in the HPI Musculoskeletal: negative other than that listed in the HPI Neurological: negative other than that listed in the HPI Allergy/Immunologic: negative other than that listed in the HPI    Objective:   Blood pressure 120/72, pulse 67, temperature 98.7 F (37.1 C), temperature source Oral, SpO2 98 %. There is no height or weight on file to calculate BMI.   Physical Exam:  General: Alert, interactive, in no acute distress. Very amusing and friendly female.  HEENT: TMs pearly gray, turbinates minimally edematous without discharge, post-pharynx mildly erythematous. Neck: Supple without thyromegaly. Lungs: Clear to auscultation  without wheezing, rhonchi or rales. No increased work of breathing. CV: Normal S1, S2 without murmurs. Capillary refill <2 seconds.  Abdomen: Nondistended, nontender. Skin: Warm and dry, without lesions or rashes. Extremities:  No clubbing, cyanosis or edema. Neuro:   Grossly intact.   Diagnostic studies:  Spirometry: results normal (FEV1: 2.65/115%, FVC: 3.38/123%, FEV1/FVC: 78%).    Spirometry consistent with normal pattern   Allergy Studies: None    Malachi BondsJoel Betul Brisky, MD [facility] [place]

## 2015-12-18 NOTE — Patient Instructions (Addendum)
1. Allergic rhinoconjunctivitis - Continue Xyzal 1/2 tablet daily. - Continue with Rhinocort. - Continue with Singulair. - We will start to advance treatment on your allergy shots.  2. Mild persistent asthma, uncomplicated - Use a spacer with your Qvar.  - We will hold off on Xolair for now.  3. Thrush - Start Diflucan 100mg  once daily for 14 days. - If you are not feeling better in a few days, call Dr. Madilyn FiremanHayes for a follow up appointment.  4. Return in about 2 months (around 02/17/2016).  Please inform us of any Emergency Department visits, hospitalizations, or changes in symptoms. Call us before going to the ED for breathing or allergy symptoms since we might be able to fit you in for a sick visit. Feel free to contact us anytime with any questions, problems, or concerns.  It was a pleasure to meet you today!   Websites that have reliable patient information: 1. American Academy of Asthma, Allergy, and Immunology: www.aaaai.org 2. Food Allergy Research and Education (FARE): foodallergy.org 3. Mothers of Asthmatics: http://www.asthmacommunitynetwork.org 4. American College of Allergy, Asthma, and Immunology: www.acaai.org

## 2015-12-25 ENCOUNTER — Telehealth: Payer: Self-pay | Admitting: *Deleted

## 2015-12-25 ENCOUNTER — Ambulatory Visit (INDEPENDENT_AMBULATORY_CARE_PROVIDER_SITE_OTHER): Payer: BLUE CROSS/BLUE SHIELD | Admitting: *Deleted

## 2015-12-25 DIAGNOSIS — H1010 Acute atopic conjunctivitis, unspecified eye: Secondary | ICD-10-CM

## 2015-12-25 DIAGNOSIS — J309 Allergic rhinitis, unspecified: Secondary | ICD-10-CM | POA: Diagnosis not present

## 2015-12-25 NOTE — Telephone Encounter (Signed)
Excellent to hear!! Thanks for passing along the message.  Malachi BondsJoel Marin Milley, MD FAAAAI [facility] [place]

## 2015-12-25 NOTE — Telephone Encounter (Signed)
Patient came in for shot today and wanted to let you know that the 2 weeks of diflucan really helped. She is now able to eat and she has lost the weight that she gained. She is going to follow up with her GI about the thrush issue.

## 2016-01-01 DIAGNOSIS — R1314 Dysphagia, pharyngoesophageal phase: Secondary | ICD-10-CM | POA: Diagnosis not present

## 2016-01-03 ENCOUNTER — Ambulatory Visit (INDEPENDENT_AMBULATORY_CARE_PROVIDER_SITE_OTHER): Payer: BLUE CROSS/BLUE SHIELD

## 2016-01-03 DIAGNOSIS — H1010 Acute atopic conjunctivitis, unspecified eye: Secondary | ICD-10-CM

## 2016-01-03 DIAGNOSIS — J309 Allergic rhinitis, unspecified: Secondary | ICD-10-CM | POA: Diagnosis not present

## 2016-01-09 ENCOUNTER — Ambulatory Visit (INDEPENDENT_AMBULATORY_CARE_PROVIDER_SITE_OTHER): Payer: BLUE CROSS/BLUE SHIELD

## 2016-01-09 DIAGNOSIS — J309 Allergic rhinitis, unspecified: Secondary | ICD-10-CM

## 2016-01-19 ENCOUNTER — Ambulatory Visit (INDEPENDENT_AMBULATORY_CARE_PROVIDER_SITE_OTHER): Payer: BLUE CROSS/BLUE SHIELD | Admitting: *Deleted

## 2016-01-19 DIAGNOSIS — J309 Allergic rhinitis, unspecified: Secondary | ICD-10-CM | POA: Diagnosis not present

## 2016-01-25 ENCOUNTER — Ambulatory Visit (INDEPENDENT_AMBULATORY_CARE_PROVIDER_SITE_OTHER): Payer: BLUE CROSS/BLUE SHIELD | Admitting: *Deleted

## 2016-01-25 DIAGNOSIS — J309 Allergic rhinitis, unspecified: Secondary | ICD-10-CM | POA: Diagnosis not present

## 2016-01-26 ENCOUNTER — Other Ambulatory Visit: Payer: Self-pay

## 2016-01-26 DIAGNOSIS — J3089 Other allergic rhinitis: Secondary | ICD-10-CM | POA: Diagnosis not present

## 2016-01-26 MED ORDER — MONTELUKAST SODIUM 10 MG PO TABS: 10.0000 mg | ORAL_TABLET | Freq: Every day | ORAL | 1 refills | Status: DC

## 2016-01-30 ENCOUNTER — Ambulatory Visit (INDEPENDENT_AMBULATORY_CARE_PROVIDER_SITE_OTHER): Payer: BLUE CROSS/BLUE SHIELD | Admitting: *Deleted

## 2016-01-30 DIAGNOSIS — J309 Allergic rhinitis, unspecified: Secondary | ICD-10-CM

## 2016-02-06 ENCOUNTER — Ambulatory Visit (INDEPENDENT_AMBULATORY_CARE_PROVIDER_SITE_OTHER): Payer: BLUE CROSS/BLUE SHIELD | Admitting: *Deleted

## 2016-02-06 DIAGNOSIS — J309 Allergic rhinitis, unspecified: Secondary | ICD-10-CM | POA: Diagnosis not present

## 2016-02-15 ENCOUNTER — Ambulatory Visit (INDEPENDENT_AMBULATORY_CARE_PROVIDER_SITE_OTHER): Payer: BLUE CROSS/BLUE SHIELD | Admitting: *Deleted

## 2016-02-15 DIAGNOSIS — J309 Allergic rhinitis, unspecified: Secondary | ICD-10-CM | POA: Diagnosis not present

## 2016-02-21 ENCOUNTER — Ambulatory Visit (INDEPENDENT_AMBULATORY_CARE_PROVIDER_SITE_OTHER): Payer: BLUE CROSS/BLUE SHIELD | Admitting: Allergy & Immunology

## 2016-02-21 ENCOUNTER — Encounter: Payer: Self-pay | Admitting: Allergy & Immunology

## 2016-02-21 ENCOUNTER — Ambulatory Visit (INDEPENDENT_AMBULATORY_CARE_PROVIDER_SITE_OTHER): Payer: BLUE CROSS/BLUE SHIELD

## 2016-02-21 ENCOUNTER — Encounter (INDEPENDENT_AMBULATORY_CARE_PROVIDER_SITE_OTHER): Payer: Self-pay

## 2016-02-21 VITALS — BP 118/68 | HR 78 | Temp 98.4°F | Resp 16 | Ht 60.5 in | Wt 137.5 lb

## 2016-02-21 DIAGNOSIS — J3089 Other allergic rhinitis: Secondary | ICD-10-CM | POA: Diagnosis not present

## 2016-02-21 DIAGNOSIS — J4540 Moderate persistent asthma, uncomplicated: Secondary | ICD-10-CM | POA: Diagnosis not present

## 2016-02-21 DIAGNOSIS — K219 Gastro-esophageal reflux disease without esophagitis: Secondary | ICD-10-CM | POA: Diagnosis not present

## 2016-02-21 DIAGNOSIS — J309 Allergic rhinitis, unspecified: Secondary | ICD-10-CM

## 2016-02-21 MED ORDER — BECLOMETHASONE DIPROPIONATE 80 MCG/ACT IN AERS: INHALATION_SPRAY | RESPIRATORY_TRACT | 4 refills | Status: DC

## 2016-02-21 NOTE — Progress Notes (Signed)
FOLLOW UP  Date of Service/Encounter:  02/21/16   Assessment:   Gastroesophageal reflux disease  Chronic allergic rhinitis  Moderate persistent asthma, uncomplicated   Asthma Reportables:  Severity: moderate persistent  Risk: low Control: well controlled  Seasonal Influenza Vaccine: yes    Plan/Recommendations:   1. Moderate persistent asthma, uncomplicated - Lung function looks fantastic today. - We will get the Qvar approved by your insurance.  - It does not seem that we have ever had a conversation with her insurance company elaborating on the need for Qvar, therefore we will attempt to get this approved. - This approval might require a peer-to-peer discussion.  - Daily controller medication(s): Qvar 80mcg one puff 3-4 times per week via spacer + Singulair 10mg  daily - Rescue medications: ProAir 4 puffs every 4-6 hours as needed - Changes during respiratory infections or worsening symptoms: increase Qvar 80mcg to 2 puffs twice daily for TWO WEEKS. - Asthma control goals:  * Full participation in all desired activities (may need albuterol before activity) * Albuterol use two time or less a week on average (not counting use with activity) * Cough interfering with sleep two time or less a month * Oral steroids no more than once a year * No hospitalizations  2. Gastroesophageal reflux disease - Continue with Nexium 40mg  twice daily. - Continue to follow up with GI.   3. Allergic rhinoconjunctivitis - Continue with Claritin 10mg  daily and Rhinocort. - We will plan to continue with the allergy shots and aim for the 0.305mL dose.  4. Return in about 3 months (around 05/21/2016).     Subjective:   Ariana Green is a 50 y.o. female presenting today for follow up of  Chief Complaint  Patient presents with  . Asthma    Follow up  . Allergic Rhinitis     conjunctivitis Follow up  .  Ariana Green has a history of the following: Patient Active Problem  List   Diagnosis Date Noted  . Allergic rhinoconjunctivitis 11/19/2014  . Asthma 11/19/2014    History obtained from: chart review and patient.  Ariana Maywoodlaire H Sciuto was referred by Pearla DubonnetGATES,ROBERT NEVILL, MD.     [name] is a 50 y.o. female presenting for a follow up visit for asthma and allergies. Ariana Green was last seen in October 2017. At that time, she was doing well from an asthma perspective. I recommended use a spacer with her Qvar, therefore we provided her with one and gave her teaching. I also continued the Singulair. We continued her on her allergy injections after a brief hold at 0.762mL of the red vial while under the care of Dr. Willa RoughHicks. Ariana Green was also have problems with throat pain and recurrent oral and esophageal thrush. I prescribed her two weeks of Diflucan to help with this.   Since last visit, she reports that she has done very well. She did complete her 2 weeks of Diflucan and had marked improvement. She no longer has a sore throat. She saw her gastroenterologist and was placed on Nexium 40 mg twice daily, which seems to be working well. Her asthma is well controlled. She has had no emergency room visits, urgent care visits, her prednisone courses since the last visit. She denies nocturnal awakenings as well as daytime wheezing and coughing. Currently, she is on Qvar 80 g 1 inhalation 3-4 times per week. This seems to keep her asthma symptoms at bay. She has been using samples provided by our practice since her  insurance does not cover Qvar. She has been on Flovent as well as Asmanex in the past, but has failed both of these. She did have increased thrush with the Asmanex and on both medication she felt that she was "on the edge". While on both Flovent and Asmanex, she felt that she was not as well controlled. However, while taking Qvar 3-4 times per week, she feels markedly improved and less anxious about worsening asthma symptoms. It does not seem that we have ever had a  conversation with her insurance company elaborating on the need for Qvar, therefore we will attempt to get this approved.  Mrs. Lendell Green has been well controlled with her allergy shots. She was on Xyzal one half of a tablet daily, but endorsed extreme drying of her mouth. She therefore changed to Claritin with improvement in her symptoms. She also remains on Rhinocort. Thus far, she has been able to increase her maintenance file to 0.45 mL. However at this dose, she developed large local reactions. These large local reactions do not seem to bother her. She would prefer to reach maintenance and then try to wean off her medications thereafter. She has never had a full blown systemic reaction to her allergy injections.  Otherwise, there have been no changes to the past medical history, surgical history, family history, or social history.     Review of Systems: a 14-point review of systems is pertinent for what is mentioned in HPI.  Otherwise, all other systems were negative. Constitutional: negative other than that listed in the HPI Eyes: negative other than that listed in the HPI Ears, nose, mouth, throat, and face: negative other than that listed in the HPI Respiratory: negative other than that listed in the HPI Cardiovascular: negative other than that listed in the HPI Gastrointestinal: negative other than that listed in the HPI Genitourinary: positive for vaginal yeast (increasing due to menopause per patient), otherwise negative other than that listed in the HPI Integument: negative other than that listed in the HPI Hematologic: negative other than that listed in the HPI Musculoskeletal: negative other than that listed in the HPI Neurological: negative other than that listed in the HPI Allergy/Immunologic: negative other than that listed in the HPI    Objective:   Blood pressure 118/68, pulse 78, temperature 98.4 F (36.9 C), temperature source Oral, resp. rate 16, height 5' 0.5" (1.537  m), weight 137 lb 8 oz (62.4 kg), SpO2 97 %. Body mass index is 26.41 kg/m.   Physical Exam:  General: Alert, interactive, in no acute distress. Very amusing and friendly female.  HEENT: TMs pearly gray, turbinates edematous and pale with clear discharge, post-pharynx mildly erythematous. Neck: Supple without thyromegaly. Lungs: Clear to auscultation without wheezing, rhonchi or rales. No increased work of breathing. CV: Normal S1, S2 without murmurs. Capillary refill <2 seconds.  Abdomen: Nondistended, nontender. Skin: Warm and dry, without lesions or rashes. Extremities:  No clubbing, cyanosis or edema. Neuro:   Grossly intact. No focal deficits noted.   Diagnostic studies:  Spirometry: results normal (FEV1: 2.77/115%, FVC: 3.56/124%, FEV1/FVC: 77%).    Spirometry consistent with normal pattern   Allergy Studies: None    Malachi BondsJoel Ameisha Mcclellan, MD [facility] [place]

## 2016-02-21 NOTE — Patient Instructions (Signed)
1. Moderate persistent asthma, uncomplicated - Lung function looks fantastic today. - We will get the Qvar approved by your insurance.  - Daily controller medication(s): Qvar 80mcg one puff 3-4 times per week via spacer + Singulair 10mg  daily - Rescue medications: ProAir 4 puffs every 4-6 hours as needed - Changes during respiratory infections or worsening symptoms: increase Qvar 80mcg to 2 puffs twice daily for TWO WEEKS. - Asthma control goals:  * Full participation in all desired activities (may need albuterol before activity) * Albuterol use two time or less a week on average (not counting use with activity) * Cough interfering with sleep two time or less a month * Oral steroids no more than once a year * No hospitalizations  2. Gastroesophageal reflux disease - Continue with Nexium 40mg  twice daily. - Continue to follow up with GI.   3. Allergic rhinoconjunctivitis - Continue with Claritin 10mg  daily and Rhinocort. - We will plan to continue with the allergy shots and aim for the 0.345mL dose.  4. Return in about 3 months (around 05/21/2016).  Please inform us of any Emergency Department visits, hospitalizations, or changes in symptoms. Call us before going to the ED for breathing or allergy symptoms since we might be able to fit you in for a sick visit. Feel free to contact us anytime with any questions, problems, or concerns.  It was a pleasure to see you and your family again today! Have a wonderful holiday season!   Websites that have reliable patient information: 1. American Academy of Asthma, Allergy, and Immunology: www.aaaai.org 2. Food Allergy Research and Education (FARE): foodallergy.org 3. Mothers of Asthmatics: http://www.asthmacommunitynetwork.org 4. American College of Allergy, Asthma, and Immunology: www.acaai.org

## 2016-03-06 ENCOUNTER — Ambulatory Visit (INDEPENDENT_AMBULATORY_CARE_PROVIDER_SITE_OTHER): Payer: BLUE CROSS/BLUE SHIELD | Admitting: *Deleted

## 2016-03-06 DIAGNOSIS — J309 Allergic rhinitis, unspecified: Secondary | ICD-10-CM

## 2016-03-07 DIAGNOSIS — J45909 Unspecified asthma, uncomplicated: Secondary | ICD-10-CM | POA: Diagnosis not present

## 2016-03-07 DIAGNOSIS — Z23 Encounter for immunization: Secondary | ICD-10-CM | POA: Diagnosis not present

## 2016-03-07 DIAGNOSIS — M189 Osteoarthritis of first carpometacarpal joint, unspecified: Secondary | ICD-10-CM | POA: Diagnosis not present

## 2016-03-07 DIAGNOSIS — E2839 Other primary ovarian failure: Secondary | ICD-10-CM | POA: Diagnosis not present

## 2016-03-07 DIAGNOSIS — J309 Allergic rhinitis, unspecified: Secondary | ICD-10-CM | POA: Diagnosis not present

## 2016-03-07 DIAGNOSIS — Z0001 Encounter for general adult medical examination with abnormal findings: Secondary | ICD-10-CM | POA: Diagnosis not present

## 2016-03-07 DIAGNOSIS — R1314 Dysphagia, pharyngoesophageal phase: Secondary | ICD-10-CM | POA: Diagnosis not present

## 2016-03-07 DIAGNOSIS — Z79899 Other long term (current) drug therapy: Secondary | ICD-10-CM | POA: Diagnosis not present

## 2016-03-07 DIAGNOSIS — N3010 Interstitial cystitis (chronic) without hematuria: Secondary | ICD-10-CM | POA: Diagnosis not present

## 2016-03-07 DIAGNOSIS — I8010 Phlebitis and thrombophlebitis of unspecified femoral vein: Secondary | ICD-10-CM | POA: Diagnosis not present

## 2016-03-07 DIAGNOSIS — E559 Vitamin D deficiency, unspecified: Secondary | ICD-10-CM | POA: Diagnosis not present

## 2016-03-07 DIAGNOSIS — K219 Gastro-esophageal reflux disease without esophagitis: Secondary | ICD-10-CM | POA: Diagnosis not present

## 2016-03-20 DIAGNOSIS — Z1231 Encounter for screening mammogram for malignant neoplasm of breast: Secondary | ICD-10-CM | POA: Diagnosis not present

## 2016-03-21 ENCOUNTER — Ambulatory Visit (INDEPENDENT_AMBULATORY_CARE_PROVIDER_SITE_OTHER): Payer: BLUE CROSS/BLUE SHIELD

## 2016-03-21 DIAGNOSIS — J309 Allergic rhinitis, unspecified: Secondary | ICD-10-CM | POA: Diagnosis not present

## 2016-03-22 ENCOUNTER — Other Ambulatory Visit: Payer: Self-pay | Admitting: *Deleted

## 2016-03-22 MED ORDER — MONTELUKAST SODIUM 10 MG PO TABS: 10.0000 mg | ORAL_TABLET | Freq: Every day | ORAL | 1 refills | Status: DC

## 2016-04-03 ENCOUNTER — Ambulatory Visit (INDEPENDENT_AMBULATORY_CARE_PROVIDER_SITE_OTHER): Payer: BLUE CROSS/BLUE SHIELD | Admitting: *Deleted

## 2016-04-03 DIAGNOSIS — J309 Allergic rhinitis, unspecified: Secondary | ICD-10-CM

## 2016-04-15 ENCOUNTER — Ambulatory Visit (INDEPENDENT_AMBULATORY_CARE_PROVIDER_SITE_OTHER): Payer: BLUE CROSS/BLUE SHIELD

## 2016-04-15 DIAGNOSIS — J309 Allergic rhinitis, unspecified: Secondary | ICD-10-CM | POA: Diagnosis not present

## 2016-04-25 ENCOUNTER — Ambulatory Visit (INDEPENDENT_AMBULATORY_CARE_PROVIDER_SITE_OTHER): Payer: BLUE CROSS/BLUE SHIELD | Admitting: *Deleted

## 2016-04-25 DIAGNOSIS — J309 Allergic rhinitis, unspecified: Secondary | ICD-10-CM | POA: Diagnosis not present

## 2016-05-07 NOTE — Addendum Note (Signed)
Addended by: Berna BueWHITAKER, Ivie Maese L on: 05/07/2016 12:22 PM   Modules accepted: Orders

## 2016-05-08 ENCOUNTER — Ambulatory Visit (INDEPENDENT_AMBULATORY_CARE_PROVIDER_SITE_OTHER): Payer: BLUE CROSS/BLUE SHIELD | Admitting: *Deleted

## 2016-05-08 DIAGNOSIS — J309 Allergic rhinitis, unspecified: Secondary | ICD-10-CM | POA: Diagnosis not present

## 2016-05-12 ENCOUNTER — Other Ambulatory Visit: Payer: Self-pay | Admitting: Allergy & Immunology

## 2016-05-15 ENCOUNTER — Ambulatory Visit (INDEPENDENT_AMBULATORY_CARE_PROVIDER_SITE_OTHER): Payer: BLUE CROSS/BLUE SHIELD | Admitting: *Deleted

## 2016-05-15 DIAGNOSIS — J309 Allergic rhinitis, unspecified: Secondary | ICD-10-CM

## 2016-05-16 DIAGNOSIS — J3089 Other allergic rhinitis: Secondary | ICD-10-CM | POA: Diagnosis not present

## 2016-05-23 ENCOUNTER — Encounter: Payer: Self-pay | Admitting: Allergy & Immunology

## 2016-05-23 ENCOUNTER — Ambulatory Visit (INDEPENDENT_AMBULATORY_CARE_PROVIDER_SITE_OTHER): Payer: BLUE CROSS/BLUE SHIELD | Admitting: Allergy & Immunology

## 2016-05-23 VITALS — BP 118/68 | HR 64 | Resp 16

## 2016-05-23 DIAGNOSIS — J4520 Mild intermittent asthma, uncomplicated: Secondary | ICD-10-CM

## 2016-05-23 DIAGNOSIS — J302 Other seasonal allergic rhinitis: Secondary | ICD-10-CM | POA: Diagnosis not present

## 2016-05-23 DIAGNOSIS — K219 Gastro-esophageal reflux disease without esophagitis: Secondary | ICD-10-CM

## 2016-05-23 NOTE — Patient Instructions (Addendum)
1. Moderate persistent asthma, uncomplicated - Lung function looks fantastic today. - We will not make any medication changes today.  - Daily controller medication(s): Singulair 10mg  daily - Rescue medications: ProAir 4 puffs every 4-6 hours as needed - Changes during respiratory infections or worsening symptoms: add on Qvar 80mcg to 2 puffs twice daily for TWO WEEKS. - Asthma control goals:  * Full participation in all desired activities (may need albuterol before activity) * Albuterol use two time or less a week on average (not counting use with activity) * Cough interfering with sleep two time or less a month * Oral steroids no more than once a year * No hospitalizations  2. Gastroesophageal reflux disease - Continue with Nexium 40mg  twice daily. - Continue to follow up with GI.   3. Allergic rhinoconjunctivitis - Continue with Claritin 10mg  daily and Rhinocort. - We will plan to continue with the allergy shots and aim for the 0.775mL dose.  4. Return in about 6 months.  Please inform us of any Emergency Department visits, hospitalizations, or changes in symptoms. Call us before going to the ED for breathing or allergy symptoms since we might be able to fit you in for a sick visit. Feel free to contact us anytime with any questions, problems, or concerns.  It was a pleasure to see you and your family again today! Happy spring!   Websites that have reliable patient information: 1. American Academy of Asthma, Allergy, and Immunology: www.aaaai.org 2. Food Allergy Research and Education (FARE): foodallergy.org 3. Mothers of Asthmatics: http://www.asthmacommunitynetwork.org 4. American College of Allergy, Asthma, and Immunology: www.acaai.org

## 2016-05-23 NOTE — Progress Notes (Signed)
FOLLOW UP  Date of Service/Encounter:  05/23/16   Assessment:   Mild intermittent asthma without complication  Gastroesophageal reflux disease  Seasonal allergic rhinitis   Asthma Reportables:  Severity: intermittent  Risk: low Control: well controlled   Plan/Recommendations:   1. Moderate persistent asthma, uncomplicated - Lung function looks fantastic today. - We will not make any medication changes today.  - Daily controller medication(s): Singulair 10mg  daily - Rescue medications: ProAir 4 puffs every 4-6 hours as needed - Changes during respiratory infections or worsening symptoms: add on Qvar 80mcg to 2 puffs twice daily for TWO WEEKS. - Asthma control goals:  * Full participation in all desired activities (may need albuterol before activity) * Albuterol use two time or less a week on average (not counting use with activity) * Cough interfering with sleep two time or less a month * Oral steroids no more than once a year * No hospitalizations  2. Gastroesophageal reflux disease - Continue with Nexium 40mg  twice daily. - Continue to follow up with GI.   3. Allergic rhinoconjunctivitis - Continue with Claritin 10mg  daily and Rhinocort. - We will plan to continue with the allergy shots and aim for the 0.535mL dose.  4. Return in about 6 months.   Subjective:   Frances MaywoodClaire H Schwinn is a 51 y.o. female presenting today for follow up of  Chief Complaint  Patient presents with  . Asthma    No flares since last OV.  Using Qvar only 2 x weekly.  . Gastroesophageal Reflux    Frances MaywoodClaire H Bodnar has a history of the following: Patient Active Problem List   Diagnosis Date Noted  . Allergic rhinoconjunctivitis 11/19/2014  . Asthma 11/19/2014    History obtained from: chart review and patient.  Frances Maywoodlaire H Ploch was referred by Pearla DubonnetGATES,ROBERT NEVILL, MD.     [name] is a 51 y.o. female presenting for a follow up visit. She was last seen in December 2017. At that  time, we changed her back to Qvar 80 g 1 puff 3-4 times a week. She has only ever responded to Qvar, but her insurance company was not approving this medication. We continued her on Claritin 10 mg daily as well as Rhinocort. She remains on allergen immunotherapy which she receives weekly. She is currently receiving 0.4 mL of her red vial containing mold, dust mite, cat, dog.  Since last visit, she has done well. Nixie's asthma has been well controlled. She has not required rescue medication, experienced nocturnal awakenings due to lower respiratory symptoms, nor have activities of daily living been limited. She is using the Qvar only every other week. She remains on her immunotherapy and is at 0.294mL. She remains on her Rhinocort which she does not use daily. This is her bad time of the year so she is going to start using it daily. Claritin does wear off closer to the 22 hour mark or so. Xyzal was drying her out. She does occasionally cycle back to cetirizine. GERD is controlled on Nexium 40mg  once daily, but she will increase to twice daily when she is planning to drink at night.   Otherwise, there have been no changes to her past medical history, surgical history, family history, or social history.    Review of Systems: a 14-point review of systems is pertinent for what is mentioned in HPI.  Otherwise, all other systems were negative. Constitutional: negative other than that listed in the HPI Eyes: negative other than that listed in the HPI  Ears, nose, mouth, throat, and face: negative other than that listed in the HPI Respiratory: negative other than that listed in the HPI Cardiovascular: negative other than that listed in the HPI Gastrointestinal: negative other than that listed in the HPI Genitourinary: negative other than that listed in the HPI Integument: negative other than that listed in the HPI Hematologic: negative other than that listed in the HPI Musculoskeletal: negative other than  that listed in the HPI Neurological: negative other than that listed in the HPI Allergy/Immunologic: negative other than that listed in the HPI    Objective:   Blood pressure 118/68, pulse 64, resp. rate 16, SpO2 98 %. There is no height or weight on file to calculate BMI.   Physical Exam:  General: Alert, interactive, in no acute distress. Pleasant female.  Eyes: No conjunctival injection present on the right, No conjunctival injection present on the left, PERRL bilaterally, No discharge on the right, No discharge on the left and No Horner-Trantas dots present Ears: Right TM pearly gray with normal light reflex, Left TM pearly gray with normal light reflex, Right TM intact without perforation and Left TM intact without perforation.  Nose/Throat: External nose within normal limits and septum midline, turbinates minimally edematous with clear discharge, post-pharynx erythematous without cobblestoning in the posterior oropharynx. Tonsils 2+ without exudates Neck: Supple without thyromegaly. Lungs: Clear to auscultation without wheezing, rhonchi or rales. No increased work of breathing. CV: Normal S1/S2, no murmurs. Capillary refill <2 seconds.  Skin: Warm and dry, without lesions or rashes. Neuro:   Grossly intact. No focal deficits appreciated. Responsive to questions.   Diagnostic studies:  Spirometry: results normal (FEV1: 2.84/111%, FVC: 3.57/112%, FEV1/FVC: 80%).    Spirometry consistent with normal pattern.   Allergy Studies: None    Malachi Bonds, MD [facility] [place]

## 2016-05-30 ENCOUNTER — Ambulatory Visit (INDEPENDENT_AMBULATORY_CARE_PROVIDER_SITE_OTHER): Payer: BLUE CROSS/BLUE SHIELD | Admitting: *Deleted

## 2016-05-30 DIAGNOSIS — J309 Allergic rhinitis, unspecified: Secondary | ICD-10-CM

## 2016-06-04 ENCOUNTER — Ambulatory Visit (INDEPENDENT_AMBULATORY_CARE_PROVIDER_SITE_OTHER): Payer: BLUE CROSS/BLUE SHIELD | Admitting: *Deleted

## 2016-06-04 DIAGNOSIS — J309 Allergic rhinitis, unspecified: Secondary | ICD-10-CM | POA: Diagnosis not present

## 2016-06-15 ENCOUNTER — Other Ambulatory Visit: Payer: Self-pay | Admitting: Allergy & Immunology

## 2016-06-18 ENCOUNTER — Telehealth: Payer: Self-pay | Admitting: *Deleted

## 2016-06-18 ENCOUNTER — Ambulatory Visit (INDEPENDENT_AMBULATORY_CARE_PROVIDER_SITE_OTHER): Payer: BLUE CROSS/BLUE SHIELD | Admitting: *Deleted

## 2016-06-18 DIAGNOSIS — J309 Allergic rhinitis, unspecified: Secondary | ICD-10-CM

## 2016-06-18 NOTE — Telephone Encounter (Signed)
Pharmacy called states Qvar hfa has been d/c and was wondering we could send in Qvar Redihaler it is not covered by insurance and a pa has to be done. Patient has tried Flovent in the past which is covered but states it did not help her. Dr Dellis Anes please advise what to switch patient to.

## 2016-06-18 NOTE — Telephone Encounter (Signed)
We could try Pulmicort one puff daily or Asmanex one puff daily. If she has tried these in the past, we can fill out the PA for Qvar Redihaler.   Malachi Bonds, MD [facility] [place]

## 2016-06-19 NOTE — Telephone Encounter (Signed)
Left message for patient to return call.

## 2016-06-20 MED ORDER — BECLOMETHASONE DIPROP HFA 80 MCG/ACT IN AERB: 2.0000 | INHALATION_SPRAY | Freq: Two times a day (BID) | RESPIRATORY_TRACT | 5 refills | Status: DC

## 2016-06-20 NOTE — Addendum Note (Signed)
Addended by: Bennye Alm on: 06/20/2016 12:16 PM   Modules accepted: Orders

## 2016-06-20 NOTE — Telephone Encounter (Signed)
Spoke to patient states she has tried and failed Asmanex and Flovent states she still has enough Qvar to get her through. Advised we would work on a PA patient verbalizes understanding

## 2016-06-24 ENCOUNTER — Ambulatory Visit (INDEPENDENT_AMBULATORY_CARE_PROVIDER_SITE_OTHER): Payer: BLUE CROSS/BLUE SHIELD | Admitting: *Deleted

## 2016-06-24 DIAGNOSIS — J309 Allergic rhinitis, unspecified: Secondary | ICD-10-CM

## 2016-07-01 DIAGNOSIS — R1314 Dysphagia, pharyngoesophageal phase: Secondary | ICD-10-CM | POA: Diagnosis not present

## 2016-07-01 DIAGNOSIS — K219 Gastro-esophageal reflux disease without esophagitis: Secondary | ICD-10-CM | POA: Diagnosis not present

## 2016-07-01 DIAGNOSIS — Z1211 Encounter for screening for malignant neoplasm of colon: Secondary | ICD-10-CM | POA: Diagnosis not present

## 2016-07-02 ENCOUNTER — Ambulatory Visit (INDEPENDENT_AMBULATORY_CARE_PROVIDER_SITE_OTHER): Payer: BLUE CROSS/BLUE SHIELD | Admitting: *Deleted

## 2016-07-02 DIAGNOSIS — J309 Allergic rhinitis, unspecified: Secondary | ICD-10-CM

## 2016-07-08 ENCOUNTER — Ambulatory Visit (INDEPENDENT_AMBULATORY_CARE_PROVIDER_SITE_OTHER): Payer: BLUE CROSS/BLUE SHIELD | Admitting: *Deleted

## 2016-07-08 DIAGNOSIS — J309 Allergic rhinitis, unspecified: Secondary | ICD-10-CM

## 2016-07-15 ENCOUNTER — Ambulatory Visit (INDEPENDENT_AMBULATORY_CARE_PROVIDER_SITE_OTHER): Payer: BLUE CROSS/BLUE SHIELD

## 2016-07-15 ENCOUNTER — Telehealth: Payer: Self-pay | Admitting: Allergy & Immunology

## 2016-07-15 DIAGNOSIS — J309 Allergic rhinitis, unspecified: Secondary | ICD-10-CM | POA: Diagnosis not present

## 2016-07-15 MED ORDER — EPINEPHRINE 0.3 MG/0.3ML IJ SOAJ: INTRAMUSCULAR | 1 refills | Status: DC

## 2016-07-15 NOTE — Telephone Encounter (Signed)
epipen sent in could not leave message

## 2016-07-15 NOTE — Telephone Encounter (Signed)
Pt came up to window to let me know that she needed a new rx for a epi-pen [facility] [address] [phone].

## 2016-07-29 ENCOUNTER — Ambulatory Visit (INDEPENDENT_AMBULATORY_CARE_PROVIDER_SITE_OTHER): Payer: BLUE CROSS/BLUE SHIELD | Admitting: *Deleted

## 2016-07-29 DIAGNOSIS — J309 Allergic rhinitis, unspecified: Secondary | ICD-10-CM | POA: Diagnosis not present

## 2016-08-12 ENCOUNTER — Ambulatory Visit (INDEPENDENT_AMBULATORY_CARE_PROVIDER_SITE_OTHER): Payer: BLUE CROSS/BLUE SHIELD | Admitting: *Deleted

## 2016-08-12 DIAGNOSIS — J309 Allergic rhinitis, unspecified: Secondary | ICD-10-CM

## 2016-08-14 DIAGNOSIS — N952 Postmenopausal atrophic vaginitis: Secondary | ICD-10-CM | POA: Diagnosis not present

## 2016-08-14 DIAGNOSIS — Z6825 Body mass index (BMI) 25.0-25.9, adult: Secondary | ICD-10-CM | POA: Diagnosis not present

## 2016-08-14 DIAGNOSIS — Z01419 Encounter for gynecological examination (general) (routine) without abnormal findings: Secondary | ICD-10-CM | POA: Diagnosis not present

## 2016-08-20 ENCOUNTER — Other Ambulatory Visit: Payer: Self-pay | Admitting: Obstetrics and Gynecology

## 2016-08-20 DIAGNOSIS — Z803 Family history of malignant neoplasm of breast: Secondary | ICD-10-CM

## 2016-08-23 DIAGNOSIS — D225 Melanocytic nevi of trunk: Secondary | ICD-10-CM | POA: Diagnosis not present

## 2016-08-23 DIAGNOSIS — L821 Other seborrheic keratosis: Secondary | ICD-10-CM | POA: Diagnosis not present

## 2016-08-23 DIAGNOSIS — D1801 Hemangioma of skin and subcutaneous tissue: Secondary | ICD-10-CM | POA: Diagnosis not present

## 2016-08-23 DIAGNOSIS — L814 Other melanin hyperpigmentation: Secondary | ICD-10-CM | POA: Diagnosis not present

## 2016-08-27 ENCOUNTER — Ambulatory Visit
Admission: RE | Admit: 2016-08-27 | Discharge: 2016-08-27 | Disposition: A | Discharge status: Home / Self Care | Payer: BLUE CROSS/BLUE SHIELD | Source: Ambulatory Visit | Attending: Obstetrics and Gynecology | Admitting: Obstetrics and Gynecology

## 2016-08-27 DIAGNOSIS — Z803 Family history of malignant neoplasm of breast: Secondary | ICD-10-CM | POA: Diagnosis not present

## 2016-08-27 MED ORDER — GADOBENATE DIMEGLUMINE 529 MG/ML IV SOLN
12.0000 mL | Freq: Once | INTRAVENOUS | Status: AC | PRN
  Administered 2016-08-27: 12 mL via INTRAVENOUS

## 2016-08-28 ENCOUNTER — Ambulatory Visit (INDEPENDENT_AMBULATORY_CARE_PROVIDER_SITE_OTHER): Payer: BLUE CROSS/BLUE SHIELD

## 2016-08-28 DIAGNOSIS — J309 Allergic rhinitis, unspecified: Secondary | ICD-10-CM

## 2016-09-06 DIAGNOSIS — J3089 Other allergic rhinitis: Secondary | ICD-10-CM | POA: Diagnosis not present

## 2016-09-20 ENCOUNTER — Ambulatory Visit (INDEPENDENT_AMBULATORY_CARE_PROVIDER_SITE_OTHER): Payer: BLUE CROSS/BLUE SHIELD

## 2016-09-20 DIAGNOSIS — J309 Allergic rhinitis, unspecified: Secondary | ICD-10-CM

## 2016-09-30 ENCOUNTER — Ambulatory Visit (INDEPENDENT_AMBULATORY_CARE_PROVIDER_SITE_OTHER): Payer: BLUE CROSS/BLUE SHIELD

## 2016-09-30 DIAGNOSIS — J309 Allergic rhinitis, unspecified: Secondary | ICD-10-CM

## 2016-10-10 ENCOUNTER — Ambulatory Visit (INDEPENDENT_AMBULATORY_CARE_PROVIDER_SITE_OTHER): Payer: BLUE CROSS/BLUE SHIELD | Admitting: *Deleted

## 2016-10-10 DIAGNOSIS — J309 Allergic rhinitis, unspecified: Secondary | ICD-10-CM | POA: Diagnosis not present

## 2016-10-18 ENCOUNTER — Ambulatory Visit (INDEPENDENT_AMBULATORY_CARE_PROVIDER_SITE_OTHER): Payer: BLUE CROSS/BLUE SHIELD

## 2016-10-18 DIAGNOSIS — J309 Allergic rhinitis, unspecified: Secondary | ICD-10-CM

## 2016-10-23 DIAGNOSIS — L700 Acne vulgaris: Secondary | ICD-10-CM | POA: Diagnosis not present

## 2016-10-24 ENCOUNTER — Ambulatory Visit (INDEPENDENT_AMBULATORY_CARE_PROVIDER_SITE_OTHER): Payer: BLUE CROSS/BLUE SHIELD | Admitting: *Deleted

## 2016-10-24 DIAGNOSIS — J309 Allergic rhinitis, unspecified: Secondary | ICD-10-CM | POA: Diagnosis not present

## 2016-10-29 ENCOUNTER — Ambulatory Visit (INDEPENDENT_AMBULATORY_CARE_PROVIDER_SITE_OTHER): Payer: BLUE CROSS/BLUE SHIELD | Admitting: *Deleted

## 2016-10-29 DIAGNOSIS — J309 Allergic rhinitis, unspecified: Secondary | ICD-10-CM

## 2016-11-05 ENCOUNTER — Ambulatory Visit (INDEPENDENT_AMBULATORY_CARE_PROVIDER_SITE_OTHER): Payer: BLUE CROSS/BLUE SHIELD | Admitting: *Deleted

## 2016-11-05 DIAGNOSIS — J309 Allergic rhinitis, unspecified: Secondary | ICD-10-CM

## 2016-11-18 ENCOUNTER — Ambulatory Visit (INDEPENDENT_AMBULATORY_CARE_PROVIDER_SITE_OTHER): Payer: BLUE CROSS/BLUE SHIELD

## 2016-11-18 DIAGNOSIS — J309 Allergic rhinitis, unspecified: Secondary | ICD-10-CM

## 2016-11-27 DIAGNOSIS — K5730 Diverticulosis of large intestine without perforation or abscess without bleeding: Secondary | ICD-10-CM | POA: Diagnosis not present

## 2016-11-27 DIAGNOSIS — D120 Benign neoplasm of cecum: Secondary | ICD-10-CM | POA: Diagnosis not present

## 2016-11-27 DIAGNOSIS — K648 Other hemorrhoids: Secondary | ICD-10-CM | POA: Diagnosis not present

## 2016-11-27 DIAGNOSIS — K317 Polyp of stomach and duodenum: Secondary | ICD-10-CM | POA: Diagnosis not present

## 2016-11-27 DIAGNOSIS — Z1211 Encounter for screening for malignant neoplasm of colon: Secondary | ICD-10-CM | POA: Diagnosis not present

## 2016-11-27 DIAGNOSIS — K2930 Chronic superficial gastritis without bleeding: Secondary | ICD-10-CM | POA: Diagnosis not present

## 2016-12-03 DIAGNOSIS — Z1211 Encounter for screening for malignant neoplasm of colon: Secondary | ICD-10-CM | POA: Diagnosis not present

## 2016-12-03 DIAGNOSIS — K317 Polyp of stomach and duodenum: Secondary | ICD-10-CM | POA: Diagnosis not present

## 2016-12-03 DIAGNOSIS — D120 Benign neoplasm of cecum: Secondary | ICD-10-CM | POA: Diagnosis not present

## 2016-12-03 DIAGNOSIS — K2930 Chronic superficial gastritis without bleeding: Secondary | ICD-10-CM | POA: Diagnosis not present

## 2016-12-06 ENCOUNTER — Ambulatory Visit (INDEPENDENT_AMBULATORY_CARE_PROVIDER_SITE_OTHER): Payer: BLUE CROSS/BLUE SHIELD

## 2016-12-06 DIAGNOSIS — J309 Allergic rhinitis, unspecified: Secondary | ICD-10-CM

## 2016-12-19 ENCOUNTER — Ambulatory Visit (INDEPENDENT_AMBULATORY_CARE_PROVIDER_SITE_OTHER): Payer: BLUE CROSS/BLUE SHIELD | Admitting: *Deleted

## 2016-12-19 DIAGNOSIS — J309 Allergic rhinitis, unspecified: Secondary | ICD-10-CM

## 2017-01-02 ENCOUNTER — Ambulatory Visit (INDEPENDENT_AMBULATORY_CARE_PROVIDER_SITE_OTHER): Payer: BLUE CROSS/BLUE SHIELD | Admitting: *Deleted

## 2017-01-02 DIAGNOSIS — J309 Allergic rhinitis, unspecified: Secondary | ICD-10-CM | POA: Diagnosis not present

## 2017-01-15 ENCOUNTER — Other Ambulatory Visit: Payer: Self-pay | Admitting: Allergy & Immunology

## 2017-01-16 ENCOUNTER — Ambulatory Visit (INDEPENDENT_AMBULATORY_CARE_PROVIDER_SITE_OTHER): Payer: BLUE CROSS/BLUE SHIELD | Admitting: *Deleted

## 2017-01-16 DIAGNOSIS — J309 Allergic rhinitis, unspecified: Secondary | ICD-10-CM | POA: Diagnosis not present

## 2017-01-22 ENCOUNTER — Encounter: Payer: Self-pay | Admitting: Allergy & Immunology

## 2017-01-22 ENCOUNTER — Ambulatory Visit: Payer: BLUE CROSS/BLUE SHIELD | Admitting: Allergy & Immunology

## 2017-01-22 VITALS — BP 108/70 | HR 72 | Resp 16

## 2017-01-22 DIAGNOSIS — J3089 Other allergic rhinitis: Secondary | ICD-10-CM | POA: Diagnosis not present

## 2017-01-22 DIAGNOSIS — J302 Other seasonal allergic rhinitis: Secondary | ICD-10-CM | POA: Insufficient documentation

## 2017-01-22 DIAGNOSIS — J4520 Mild intermittent asthma, uncomplicated: Secondary | ICD-10-CM | POA: Diagnosis not present

## 2017-01-22 DIAGNOSIS — K219 Gastro-esophageal reflux disease without esophagitis: Secondary | ICD-10-CM | POA: Diagnosis not present

## 2017-01-22 MED ORDER — MONTELUKAST SODIUM 10 MG PO TABS: 10.0000 mg | ORAL_TABLET | Freq: Every day | ORAL | 5 refills | Status: DC

## 2017-01-22 MED ORDER — ALBUTEROL SULFATE HFA 108 (90 BASE) MCG/ACT IN AERS: INHALATION_SPRAY | RESPIRATORY_TRACT | 1 refills | Status: DC

## 2017-01-22 NOTE — Progress Notes (Deleted)
FOLLOW UP  Date of Service/Encounter:  01/22/17   Assessment:   Mild intermittent asthma without complication - Plan: Spirometry with Graph   Asthma Reportables:  Severity: {Blank single:19197::"intermittent","moderate persistent","severe persistent","mild persistent"}  Risk: {DESC; LOW/MEDIUM/HIGH:23084} Control: {Asthma Control (Reporting):20434}  Seasonal Influenza Vaccine: {Blank single:19197::"no but encouraged","refused","yes"}   PPSV-23 Vaccine (CDC recommended for patients with persistent asthma 19-64yo): {Responses; yes/no/refused:32142}    Plan/Recommendations:    There are no Patient Instructions on file for this visit.    Subjective:   Ariana Green is a 51 y.o. female presenting today for follow up of  Chief Complaint  Patient presents with  . Asthma    Ariana MaywoodClaire Green Green has a history of the following: Patient Active Problem List   Diagnosis Date Noted  . Allergic rhinoconjunctivitis 11/19/2014  . Asthma 11/19/2014    History obtained from: chart review and ***.  Ariana Green [facility]'s Primary Care Provider is Marden NobleGates, Robert, MD.     Ariana RipperClaire is a 51 y.o. female presenting for a {Blank single:19197::"sick visit","follow up visit"}.   Since the last visit,   Asthma/Respiratory Symptom History:   Allergic Rhinitis Symptom History:    Food Allergy Symptom History:   Otherwise, there have been no changes to her past medical history, surgical history, family history, or social history.    Review of Systems: a 14-point review of systems is pertinent for what is mentioned in HPI.  Otherwise, all other systems were negative. Constitutional: negative other than that listed in the HPI Eyes: negative other than that listed in the HPI Ears, nose, mouth, throat, and face: negative other than that listed in the HPI Respiratory: negative other than that listed in the HPI Cardiovascular: negative other than that listed in the HPI Gastrointestinal:  negative other than that listed in the HPI Genitourinary: negative other than that listed in the HPI Integument: negative other than that listed in the HPI Hematologic: negative other than that listed in the HPI Musculoskeletal: negative other than that listed in the HPI Neurological: negative other than that listed in the HPI Allergy/Immunologic: negative other than that listed in the HPI    Objective:   Blood pressure 108/70, pulse 72, resp. rate 16. There is no height or weight on file to calculate BMI.   Physical Exam:  General: Alert, interactive, in no acute distress. Eyes: {Blank multiple:19196::"No conjunctival injection present on the right","No conjunctival injection present on the left","No conjunctival injection bilaterally","Conjunctival injection on the right with limbal sparing","Conjunctival injection on the left with limbal sparing","Conjunctival injection bilaterally with limbal sparing","no discharge on the right","no discharge on the left","no Horner-Trantas dots present","allergic shiners present bilaterally","***"}. PERRL bilaterally. EOMI without pain. No photophobia.  Ears: {Blank multiple:19196::"Right TM pearly gray with normal light reflex","Left TM pearly gray with normal light reflex","Right TM erythematous but not bulging","Left TM erythematous but not bulging","Right TM erythematous and bulging","Left TM erythematous and bulging","Right OME","Left OME","Right TM intact without perforation","Left TM intact without perforation","Right TM unable to be visualized due to cerumen impaction","Left TM unable to be visualized due to cerumen impaction","Patent tympanostomy tube present on the right","Patent tympanostomy tube present on the left","***"}.  Nose/Throat: {Blank multiple:19196::"External nose within normal limits","nasal crease present","septum midline"}. Turbinates {Blank single:19197::"non-edematous","edematous","edematous and pale","markedly  edematous","markedly edematous and pale","moderately edematous","mildly edematous","minimally edematous"} {Blank single:19197::"with crusty discharge","with thick discharge","with clear discharge","without discharge"}. Posterior oropharynx {Blank single:19197::"unremarkable","non erythematous","erythematous","markedly erythematous","moderately erythematous","mildly erythematous"} {Blank single:19197::"with cobblestoning in the posterior oropharynx","without cobblestoning in the posterior oropharynx"}. Tonsils {Blank single:19197::"surgically absent","unremarklable","3+","4+","touching","2+"} {Blank single:19197::"with exudates","without exudates"}.  {Blank multiple:19196::"Tongue  without thrush","Geographic tongue present"}. Adenopathy: {Blank multiple:19196::"shoddy bilateral anterior cervical lymphadenopathy","no enlarged lymph nodes appreciated in the occipital, axillary, epitrochlear, inguinal, or popliteal regions.","no enlarged lymph nodes appreciated in the anterior cervical, occipital, axillary, epitrochlear, inguinal, or popliteal regions."} Lungs: {Blank single:19197::"Decreased breath sounds with expiratory wheezing bilaterally","Mildly decreased breath sounds with expiratory wheezing bilaterally","Decreased breath sounds bilaterally without wheezing, rhonchi or rales","Mildly decreased breath sounds bilaterally without wheezing, rhonchi or rales","Clear to auscultation without wheezing, rhonchi or rales"}. {Blank single:19197::"Increased work of breathing","No increased work of breathing"}. CV: {Blank single:19197::"Physiologic splitting of S1/S2","Normal S1/S2"}. No murmurs. Capillary refill <2 seconds.  Skin: {Blank single:19197::"Dry, erythematous, excoriated patches on the ***","Dry, hyperpigmented, thickened patches on the ***","Dry, mildly hyperpigmented, mildly thickened patches on the ***","Scattered erythematous urticarial type lesions primarily located *** , nonvesicular","Warm and dry,  without lesions or rashes"}. Neuro:   Grossly intact. No focal deficits appreciated. Responsive to questions.  Diagnostic studies: none***  Spirometry: {Blank single:19197::"results normal (FEV1: ***%, FVC: ***%, FEV1/FVC: ***%)","results abnormal (FEV1: ***%, FVC: ***%, FEV1/FVC: ***%)"}.    {Blank single:19197::"Spirometry consistent with mild obstructive disease","Spirometry consistent with moderate obstructive disease","Spirometry consistent with severe obstructive disease","Spirometry consistent with possible restrictive disease","Spirometry consistent with mixed obstructive and restrictive disease","Spirometry uninterpretable due to technique","Spirometry consistent with normal pattern"}. {Blank single:19197::"Albuterol/Atrovent nebulizer","Xopenex/Atrovent nebulizer","Albuterol nebulizer"} treatment given in clinic with {Blank single:19197::"significant improvement in FEV1 per ATS criteria","significant improvement in FVC per ATS criteria","significant improvement in FEV1 and FVC per ATS criteria","improvement in FEV1, but not significant per ATS criteria","improvement in FVC, but not significant per ATS criteria","improvement in FEV1 and FVC, but not significant per ATS criteria","no improvement"}.  Allergy Studies: none***  ***Indoor/Outdoor Percutaneous Adult Environmental Panel: positive to {Blank multiple:19196::"bahia grass","Bermuda grass","johnson grass","[place] blue grass","meadow fescue grass","perennial rye grass","sweet vernal grass","timothy grass","cocklebur","burweed marsh elder","short ragweed","giant ragweed","English plantain","lamb's quarters","sheep sorrel","rough pigweed","rough marsh elder","common Boston Scientificmugwort","ash","birch","American beech","Box elder","red cedar","eastern cottonwood","elm","hickory","maple","oak","pecan pollen","pine","Eastern sycamore","black walnut  pollen","Alternaria","Cladosporium","Aspergillus","Penicillium","Bipolaris","Drechslera","Mucor","Fusarium","Aureobasidium","Rhizopus","Botrytis","epicoccum","Phoma","Candida","Tricophyton","Df mite","Dp mites","cat","dog","mixed feather","horse","cockroach","mouse","tobacco"}. Otherwise negative with adequate controls.  ***Indoor/Outdoor Percutaneous Pediatric Environmental Panel: positive to {Blank multiple:19196::"Bermuda grass","[place] blue grass","perennial rye grass","timothy grass","short ragweed","giant ragweed","Birch","Hickory","Oak","Alternaria","Cladosporium","Aspergillus","Penicillium","Bipolaris","Drechslera","Mucor","Fusarium","Aureobasidium","Rhizopus","Epicoccum","Phoma","D farinae dust mite","Cat","Dog","D pteronyssinus dust mite","mixed feather","cockroach","Candida"}. Otherwise negative with adequate controls.  ***Indoor/Outdoor Selected Intradermal Environmental Panel: positive to {Blank multiple:19196::"Bermuda grass","Johnson grass","Grass mix","ragweed mix","weed mix","tree mix","mold mix #1","mold mix #2","mold mix #3","mold mix #4","cat","dog","cockroach","mite mix"}. Otherwise negative with adequate controls.  ***Most Common Foods Panel (peanut, cashew, soy, fish mix, shellfish mix, wheat, milk, egg):  ***Full Food Panel: positive to {Blank multiple:19196::"Peanut (*** x ***)","Soy (*** x ***)","Wheat (*** x ***)","Corn (*** x ***)","Milk (*** x ***)","Egg (*** x ***)","Casein (*** x ***)","Shellfish Mix (*** x ***)","Fish Mix (*** x ***)","Cashew (*** x ***)","Tomato (*** x ***)","Orange (*** x ***)","Rice (*** x ***)","Pork (*** x ***)","Malawiurkey (*** x ***)","Beef (*** x ***)","Lamb (*** x ***)","Chicken (*** x ***)","White Potato (*** x ***)","Banana (*** x ***)","Apple (*** x ***)","Peach (*** x ***)","Sweet Potato (*** x ***)","Green Pea (*** x ***)","Strawberry (*** x ***)","Onion (*** x ***)","Cabbage (*** x ***)","Carrots (*** x ***)","Celery (*** x ***)","Karaya Gum (***  x ***)","Acacia (Arabic Gum) (*** x ***)","Cottonseed (*** x ***)","Flounder (*** x ***)","Trout (*** x ***)","Shrimp (*** x ***)","Crab (*** x ***)","Tuna (*** x ***)","Cantaloupe (*** x ***)","Watermelon (*** x ***)","Pecan (*** x ***)","Walnut (*** x ***)","Chocolate (*** x ***)","Grape (*** x ***)","Cucumber (*** x ***)","Saccharomycese Cerevisiae (*** x ***)","Oyster (*** x ***)","Lobster (*** x ***)","Scallop (*** x ***)","Salmon (*** x ***)","Almond (*** x ***)","Hazelnut (*** x ***)","EstoniaBrazil nut (*** x ***)","Coconut (*** x ***)","Cinnamon (*** x ***)","Nutmeg (*** x ***)","Ginger (*** x ***)","Garlic (*** x ***)","Black Pepper (*** x ***)","Mustard (*** x ***)","Barley (***  x ***)","Oat (*** x ***)","Rye (*** x ***)","Sesame (*** x ***)","Pineapple (*** x ***)"} with adequate controls. Negative to {Blank multiple:19196::"Peanut","Soy","Wheat","Corn","Milk","Egg","Casein","Shellfish Mix","Fish Mix","Cashew","Tomato","Orange","Rice","Pork","Turkey","Beef","Lamb","Chicken","White Potato","Banana","Apple","Peach","Sweet Potato","Green Pea","Strawberry","Onion","Cabbage","Carrots","Celery","Karaya Gum","Acacia (Arabic Gum)","Cottonseed","Flounder","Trout","Shrimp","Crab","Tuna","Cantaloupe","Watermelon","Pecan","Walnut","Chocolate","Grape","Cucumber","Saccharomycese Cerevisiae","Oyster","Lobster","Scallop","Salmon","Almond","Hazelnut","Brazil nut","Coconut","Cinnamon","Nutmeg","Ginger","Garlic","Black Pepper","Mustard","Barley","Oat","Rye","Sesame","Pineapple"}   ***Selected Foods Panel:     Malachi Bonds, MD FAAAAI [facility]

## 2017-01-22 NOTE — Progress Notes (Signed)
FOLLOW UP  Date of Service/Encounter:  01/22/17   Assessment:   Mild intermittent asthma without complication  Perennial and seasonal allergic rhinitis (cats, dog, dust mite, indoor/outdoor molds)  Gastroesophageal reflux disease  Plan/Recommendations:   1. Moderate persistent asthma, uncomplicated - Lung function looks fantastic today. - We will not make any medication changes today.  - Daily controller medication(s): Singulair 10mg  daily - Rescue medications: ProAir 4 puffs every 4-6 hours as needed - Changes during respiratory infections or worsening symptoms: add on Qvar 80mcg to 2 puffs twice daily for TWO WEEKS. - Asthma control goals:  * Full participation in all desired activities (may need albuterol before activity) * Albuterol use two time or less a week on average (not counting use with activity) * Cough interfering with sleep two time or less a month * Oral steroids no more than once a year * No hospitalizations  2. Gastroesophageal reflux disease - Continue with Nexium 40mg  twice daily. - Continue to follow up with GI.   3. Allergic rhinoconjunctivitis - Continue with Claritin 10mg  daily and Rhinocort. - Anticipate five years of allergen immunotherapy treatment (October 2020).  - Since she has been on Red for so long, I think it is OK to change to monthly injections.  - I called patient to let her know of this change.   4. Return in about 1 year (around 01/22/2018).   Subjective:   Ariana Green is a 51 y.o. female presenting today for follow up of  Chief Complaint  Patient presents with  . Asthma    Ariana MaywoodClaire H Green has a history of the following: Patient Active Problem List   Diagnosis Date Noted  . Gastroesophageal reflux disease 01/22/2017  . Perennial allergic rhinitis 01/22/2017  . Allergic rhinoconjunctivitis 11/19/2014  . Asthma 11/19/2014    History obtained from: chart review and patient.  Ariana Green's Primary Care  Provider is Marden NobleGates, Robert, MD.     Ariana RipperClaire is a 51 y.o. female presenting for a follow up visit. She was last seen in March 2018. At that time, her lung function looked very stable. We did not make any changes at that time. We continued her on Singulair 10mg  daily as well as Qvar added twice daily during respiratory flares. She was having some localized reactions with her allergen immunotherapy, but we pushed through to the 0.665mL dose and she has since achieved that. We continued her on Claritin as well as Rhinocort for her allergic rhinitis symptoms  Since the last visit, she has done well. She did try going off of the Qvar entirely, but she ended up having an asthma exacerbation while on vacation for Easter. She used her albuterol regularly for a few days with improvement. Since that time, she has been using her Qvar approximately one puff once weekly, which seems to have kept her acute symptoms at bay. In fact, she has not used her albuterol since that Easter trip. Otherwise, [name]'s asthma has been well controlled. She has not required rescue medication, experienced nocturnal awakenings due to lower respiratory symptoms, nor have activities of daily living been limited. She has required no Emergency Department or Urgent Care visits for her asthma. She has required zero courses of systemic steroids for asthma exacerbations since the last visit. ACT score today is 25, indicating excellent asthma symptom control.   Her allergic rhinitis symptoms have done well.  She remains at 0.5 mL of her red vial, now spaced to every 2 weeks.  This  is her second red vial. She receives one injection. Immunotherapy script #1 contains molds, dust mites, cat and dog. She currently receives 0.2150mL of the RED vial (1/100). She started shots January of 2015 and reached maintenance in October of 2015, but did not reach 0.815mL of the [employer]ed Vial until March 2018.   Otherwise, there have been no changes to her past medical history,  surgical history, family history, or social history.    Review of Systems: a 14-point review of systems is pertinent for what is mentioned in HPI.  Otherwise, all other systems were negative. Constitutional: negative other than that listed in the HPI Eyes: negative other than that listed in the HPI Ears, nose, mouth, throat, and face: negative other than that listed in the HPI Respiratory: negative other than that listed in the HPI Cardiovascular: negative other than that listed in the HPI Gastrointestinal: negative other than that listed in the HPI Genitourinary: negative other than that listed in the HPI Integument: negative other than that listed in the HPI Hematologic: negative other than that listed in the HPI Musculoskeletal: negative other than that listed in the HPI Neurological: negative other than that listed in the HPI Allergy/Immunologic: negative other than that listed in the HPI    Objective:   Blood pressure 108/70, pulse 72, resp. rate 16. There is no height or weight on file to calculate BMI.   Physical Exam:  General: Alert, interactive, in no acute distress. Eyes: No conjunctival injection bilaterally, no discharge on the right, no discharge on the left and no Horner-Trantas dots present. PERRL bilaterally. EOMI without pain. No photophobia.  Ears: Right TM pearly gray with normal light reflex, Left TM pearly gray with normal light reflex, Right TM intact without perforation and Left TM intact without perforation.  Nose/Throat: External nose within normal limits and septum midline. Turbinates edematous and pale with clear discharge. Posterior oropharynx mildly erythematous without cobblestoning in the posterior oropharynx. Tonsils 2+ without exudates.  Tongue without thrush. Adenopathy: no enlarged lymph nodes appreciated in the anterior cervical, occipital, axillary, epitrochlear, inguinal, or popliteal regions. Lungs: Clear to auscultation without wheezing,  rhonchi or rales. No increased work of breathing. CV: Normal S1/S2. No murmurs. Capillary refill <2 seconds.  Skin: Warm and dry, without lesions or rashes. Neuro:   Grossly intact. No focal deficits appreciated. Responsive to questions.  Diagnostic studies:   Spirometry: results normal (FEV1: 2.73/108%, FVC: 3.41/107%, FEV1/FVC: 80%).    Spirometry consistent with normal pattern.  Allergy Studies: none     Malachi BondsJoel Karis Emig, MD [facility] [place]

## 2017-01-22 NOTE — Patient Instructions (Addendum)
1. Moderate persistent asthma, uncomplicated - Lung function looks fantastic today. - We will not make any medication changes today.  - Daily controller medication(s): Singulair 10mg  daily - Rescue medications: ProAir 4 puffs every 4-6 hours as needed - Changes during respiratory infections or worsening symptoms: add on Qvar 80mcg to 2 puffs twice daily for TWO WEEKS. - Asthma control goals:  * Full participation in all desired activities (may need albuterol before activity) * Albuterol use two time or less a week on average (not counting use with activity) * Cough interfering with sleep two time or less a month * Oral steroids no more than once a year * No hospitalizations  2. Gastroesophageal reflux disease - Continue with Nexium 40mg  twice daily. - Continue to follow up with GI.   3. Allergic rhinoconjunctivitis - Continue with Claritin 10mg  daily and Rhinocort. - Anticipate five years of allergen immunotherapy treatment (October 2020).   4. Return in about 1 year (around 01/22/2018).   Please inform us of any Emergency Department visits, hospitalizations, or changes in symptoms. Call us before going to the ED for breathing or allergy symptoms since we might be able to fit you in for a sick visit. Feel free to contact us anytime with any questions, problems, or concerns.  It was a pleasure to see you again today! Enjoy the fall season!  Websites that have reliable patient information: 1. American Academy of Asthma, Allergy, and Immunology: www.aaaai.org 2. Food Allergy Research and Education (FARE): foodallergy.org 3. Mothers of Asthmatics: http://www.asthmacommunitynetwork.org 4. American College of Allergy, Asthma, and Immunology: www.acaai.org

## 2017-01-23 NOTE — Progress Notes (Signed)
Vial exp. 01/24/18

## 2017-01-24 DIAGNOSIS — J3089 Other allergic rhinitis: Secondary | ICD-10-CM | POA: Diagnosis not present

## 2017-02-24 ENCOUNTER — Ambulatory Visit (INDEPENDENT_AMBULATORY_CARE_PROVIDER_SITE_OTHER): Payer: BLUE CROSS/BLUE SHIELD | Admitting: Allergy & Immunology

## 2017-02-24 DIAGNOSIS — J309 Allergic rhinitis, unspecified: Secondary | ICD-10-CM | POA: Diagnosis not present

## 2017-02-27 DIAGNOSIS — R05 Cough: Secondary | ICD-10-CM | POA: Diagnosis not present

## 2017-02-27 DIAGNOSIS — R0989 Other specified symptoms and signs involving the circulatory and respiratory systems: Secondary | ICD-10-CM | POA: Diagnosis not present

## 2017-02-27 DIAGNOSIS — J203 Acute bronchitis due to coxsackievirus: Secondary | ICD-10-CM | POA: Diagnosis not present

## 2017-03-10 ENCOUNTER — Encounter: Payer: Self-pay | Admitting: Allergy & Immunology

## 2017-03-10 ENCOUNTER — Ambulatory Visit: Payer: BLUE CROSS/BLUE SHIELD | Admitting: Allergy & Immunology

## 2017-03-10 VITALS — BP 108/72 | HR 82 | Temp 98.3°F | Resp 18

## 2017-03-10 DIAGNOSIS — J4541 Moderate persistent asthma with (acute) exacerbation: Secondary | ICD-10-CM | POA: Diagnosis not present

## 2017-03-10 DIAGNOSIS — J3089 Other allergic rhinitis: Secondary | ICD-10-CM | POA: Diagnosis not present

## 2017-03-10 MED ORDER — METHYLPREDNISOLONE ACETATE 40 MG/ML IJ SUSP
40.0000 mg | Freq: Once | INTRAMUSCULAR | Status: AC
  Administered 2017-03-10: 40 mg via INTRAMUSCULAR

## 2017-03-10 NOTE — Progress Notes (Signed)
FOLLOW UP  Date of Service/Encounter:  03/10/17   Assessment:   Moderate persistent asthma with acute exacerbation  Perennial allergic rhinitis (molds, cat, dog, dust mite) - on allergen immunotherapy  Plan/Recommendations:   1. Moderate persistent asthma with acute exacerbation - s/p one course of azithromycin - Lung testing looks great today.  - Steroid injection provided today. - Start the prednisone pack provided as well. - Call us if there is no improvement and we can send in another antibiotic. - My work cell is [phone].   2. Return in about 1 year (around 03/10/2018), or if symptoms worsen or fail to improve.  Subjective:   Frances MaywoodClaire H Percle is a 51 y.o. female presenting today for follow up of  Chief Complaint  Patient presents with  . Asthma    Frances MaywoodClaire H Legore has a history of the following: Patient Active Problem List   Diagnosis Date Noted  . Gastroesophageal reflux disease 01/22/2017  . Perennial allergic rhinitis 01/22/2017  . Allergic rhinoconjunctivitis 11/19/2014  . Asthma 11/19/2014    History obtained from: chart review and patient.  [name] H [place]'s Primary Care Provider is Marden NobleGates, Robert, MD.     [name] is a 51 y.o. female presenting for a sick visit. She has a history of asthma as well as allergic rhinitis (on immunotherapy). She was last seen in November 2018 for her annual visit. At that time, she was doing well. We continued her on Singulair daily for her asthma with Qvar added during respiratory flares. GERD was controlled with Nexium 40mg  daily and her allergic rhinitis was controlled with the allergy injections as well as Claritin and Rhinocort as needed.   Since the last visit, she reports that she has had problems with breathing for the past two weeks. She developed bronchitis on Tuesday December 18th. She has tried to deal with it since then. She did try Tussionex without improvement. She had a fever over the weekend that seems  to have resolved. She did try to use her Qvar and Albuterol. She saw her PCP and was started on azithromycin on December 23rd and finished it on the 27th. She has been using the rescue inhaler with minimal improvement. She has not been on steroids during the time. Overall she feels that she is getting better, but the cough has remained persistent. Unfortunately, she even had to cancel her plans to travel to [place] over the Christmas holidays; thankfully they did have traveler's insurance.   Otherwise, there have been no changes to her past medical history, surgical history, family history, or social history.    Review of Systems: a 14-point review of systems is pertinent for what is mentioned in HPI.  Otherwise, all other systems were negative. Constitutional: negative other than that listed in the HPI Eyes: negative other than that listed in the HPI Ears, nose, mouth, throat, and face: negative other than that listed in the HPI Respiratory: negative other than that listed in the HPI Cardiovascular: negative other than that listed in the HPI Gastrointestinal: negative other than that listed in the HPI Genitourinary: negative other than that listed in the HPI Integument: negative other than that listed in the HPI Hematologic: negative other than that listed in the HPI Musculoskeletal: negative other than that listed in the HPI Neurological: negative other than that listed in the HPI Allergy/Immunologic: negative other than that listed in the HPI    Objective:   Blood pressure 108/72, pulse 82, temperature 98.3 F (36.8 C), resp. rate  18, SpO2 98 %. There is no height or weight on file to calculate BMI.   Physical Exam:  General: Alert, interactive, in no acute distress. Very interactive. Somewhat fatigued appearing.  Eyes: No conjunctival injection bilaterally, no discharge on the right, no discharge on the left and no Horner-Trantas dots present. PERRL bilaterally. EOMI without pain.  No photophobia.  Ears: Right TM pearly gray with normal light reflex, Left TM pearly gray with normal light reflex, Right TM intact without perforation and Left TM intact without perforation.  Nose/Throat: External nose within normal limits and septum midline. Turbinates markedly edematous and pale with clear discharge. Posterior oropharynx markedly erythematous with cobblestoning in the posterior oropharynx. Tonsils 2+ without exudates.  Tongue without thrush. Adenopathy: no enlarged lymph nodes appreciated in the anterior cervical, occipital, axillary, epitrochlear, inguinal, or popliteal regions. Lungs: Clear to auscultation without wheezing, rhonchi or rales. No increased work of breathing. CV: Normal S1/S2. No murmurs. Capillary refill <2 seconds.  Skin: Warm and dry, without lesions or rashes. Neuro:   Grossly intact. No focal deficits appreciated. Responsive to questions.  Diagnostic studies:   Spirometry: results abnormal (FEV1: 2.67/107%, FVC: 3.45/112%, FEV1/FVC: 77%).   Spirometry consistent with normal pattern. Overall values are stable compared to those obtained at the last visit.   Allergy Studies: none    Malachi BondsJoel Adelyn Roscher, MD [facility]

## 2017-03-10 NOTE — Patient Instructions (Addendum)
1. Moderate persistent asthma with acute exacerbation - Lung testing looks great today.  - Steroid injection provided today. - Start the prednisone pack provided as well. - Call us if there is no improvement and we can send in another antibiotics. - My work cell is [phone].   2. Return in about 1 year (around 03/10/2018), or if symptoms worsen or fail to improve.   Please inform us of any Emergency Department visits, hospitalizations, or changes in symptoms. Call us before going to the ED for breathing or allergy symptoms since we might be able to fit you in for a sick visit. Feel free to contact us anytime with any questions, problems, or concerns.  It was a pleasure to see you again today! Enjoy the holiday season! Sorry about your trip to [place]...   Websites that have reliable patient information: 1. American Academy of Asthma, Allergy, and Immunology: www.aaaai.org 2. Food Allergy Research and Education (FARE): foodallergy.org 3. Mothers of Asthmatics: http://www.asthmacommunitynetwork.org 4. American College of Allergy, Asthma, and Immunology: www.acaai.org

## 2017-03-11 ENCOUNTER — Encounter: Payer: Self-pay | Admitting: Allergy & Immunology

## 2017-03-17 ENCOUNTER — Telehealth: Payer: Self-pay

## 2017-03-17 MED ORDER — CEFDINIR 300 MG PO CAPS: 300.0000 mg | ORAL_CAPSULE | Freq: Two times a day (BID) | ORAL | 0 refills | Status: AC

## 2017-03-17 NOTE — Telephone Encounter (Signed)
Patient informed and advised.

## 2017-03-17 NOTE — Telephone Encounter (Signed)
Please advise and thank you. 

## 2017-03-17 NOTE — Telephone Encounter (Signed)
Patient seen on 03/10/17 by Dr Dellis AnesGallagher. Patient is still not feeling well. She finished her steroid on Friday & over the weekend started feeling worse. She has been using her rescue inhaler frequently. Her chest still feels heavy with a cough.   [facility] [address]

## 2017-03-17 NOTE — Telephone Encounter (Signed)
Reviewed note. I sent in a script for cefdinir 300mg  twice daily for ten days. This has different coverage compared to the azithromycin that she had already completed. Please the patient a note to let her know.  Thanks, Malachi BondsJoel Willie Loy, MD [facility]

## 2017-03-24 ENCOUNTER — Ambulatory Visit (INDEPENDENT_AMBULATORY_CARE_PROVIDER_SITE_OTHER): Payer: BLUE CROSS/BLUE SHIELD

## 2017-03-24 DIAGNOSIS — J309 Allergic rhinitis, unspecified: Secondary | ICD-10-CM

## 2017-03-25 DIAGNOSIS — Z1231 Encounter for screening mammogram for malignant neoplasm of breast: Secondary | ICD-10-CM | POA: Diagnosis not present

## 2017-03-25 DIAGNOSIS — Z1239 Encounter for other screening for malignant neoplasm of breast: Secondary | ICD-10-CM | POA: Diagnosis not present

## 2017-04-17 ENCOUNTER — Ambulatory Visit (INDEPENDENT_AMBULATORY_CARE_PROVIDER_SITE_OTHER): Payer: BLUE CROSS/BLUE SHIELD | Admitting: *Deleted

## 2017-04-17 DIAGNOSIS — J309 Allergic rhinitis, unspecified: Secondary | ICD-10-CM

## 2017-04-22 ENCOUNTER — Ambulatory Visit (INDEPENDENT_AMBULATORY_CARE_PROVIDER_SITE_OTHER): Payer: BLUE CROSS/BLUE SHIELD | Admitting: *Deleted

## 2017-04-22 DIAGNOSIS — J309 Allergic rhinitis, unspecified: Secondary | ICD-10-CM

## 2017-04-29 ENCOUNTER — Ambulatory Visit (INDEPENDENT_AMBULATORY_CARE_PROVIDER_SITE_OTHER): Payer: BLUE CROSS/BLUE SHIELD | Admitting: *Deleted

## 2017-04-29 DIAGNOSIS — J309 Allergic rhinitis, unspecified: Secondary | ICD-10-CM | POA: Diagnosis not present

## 2017-05-07 ENCOUNTER — Ambulatory Visit (INDEPENDENT_AMBULATORY_CARE_PROVIDER_SITE_OTHER): Payer: BLUE CROSS/BLUE SHIELD

## 2017-05-07 DIAGNOSIS — J309 Allergic rhinitis, unspecified: Secondary | ICD-10-CM | POA: Diagnosis not present

## 2017-05-13 DIAGNOSIS — Z13220 Encounter for screening for lipoid disorders: Secondary | ICD-10-CM | POA: Diagnosis not present

## 2017-05-13 DIAGNOSIS — M159 Polyosteoarthritis, unspecified: Secondary | ICD-10-CM | POA: Diagnosis not present

## 2017-05-13 DIAGNOSIS — K219 Gastro-esophageal reflux disease without esophagitis: Secondary | ICD-10-CM | POA: Diagnosis not present

## 2017-05-13 DIAGNOSIS — N3010 Interstitial cystitis (chronic) without hematuria: Secondary | ICD-10-CM | POA: Diagnosis not present

## 2017-05-13 DIAGNOSIS — J45909 Unspecified asthma, uncomplicated: Secondary | ICD-10-CM | POA: Diagnosis not present

## 2017-05-13 DIAGNOSIS — J309 Allergic rhinitis, unspecified: Secondary | ICD-10-CM | POA: Diagnosis not present

## 2017-05-13 DIAGNOSIS — E2839 Other primary ovarian failure: Secondary | ICD-10-CM | POA: Diagnosis not present

## 2017-05-13 DIAGNOSIS — E559 Vitamin D deficiency, unspecified: Secondary | ICD-10-CM | POA: Diagnosis not present

## 2017-05-13 DIAGNOSIS — D6859 Other primary thrombophilia: Secondary | ICD-10-CM | POA: Diagnosis not present

## 2017-05-13 DIAGNOSIS — Z0001 Encounter for general adult medical examination with abnormal findings: Secondary | ICD-10-CM | POA: Diagnosis not present

## 2017-05-13 DIAGNOSIS — Z79899 Other long term (current) drug therapy: Secondary | ICD-10-CM | POA: Diagnosis not present

## 2017-06-05 ENCOUNTER — Ambulatory Visit (INDEPENDENT_AMBULATORY_CARE_PROVIDER_SITE_OTHER): Payer: BLUE CROSS/BLUE SHIELD | Admitting: *Deleted

## 2017-06-05 DIAGNOSIS — J309 Allergic rhinitis, unspecified: Secondary | ICD-10-CM

## 2017-07-02 ENCOUNTER — Ambulatory Visit (INDEPENDENT_AMBULATORY_CARE_PROVIDER_SITE_OTHER): Payer: BLUE CROSS/BLUE SHIELD | Admitting: *Deleted

## 2017-07-02 DIAGNOSIS — J309 Allergic rhinitis, unspecified: Secondary | ICD-10-CM

## 2017-07-19 ENCOUNTER — Other Ambulatory Visit: Payer: Self-pay | Admitting: Allergy & Immunology

## 2017-07-31 ENCOUNTER — Ambulatory Visit (INDEPENDENT_AMBULATORY_CARE_PROVIDER_SITE_OTHER): Payer: BLUE CROSS/BLUE SHIELD

## 2017-07-31 DIAGNOSIS — J309 Allergic rhinitis, unspecified: Secondary | ICD-10-CM | POA: Diagnosis not present

## 2017-08-14 ENCOUNTER — Other Ambulatory Visit: Payer: Self-pay | Admitting: Allergy & Immunology

## 2017-08-18 DIAGNOSIS — Z6825 Body mass index (BMI) 25.0-25.9, adult: Secondary | ICD-10-CM | POA: Diagnosis not present

## 2017-08-18 DIAGNOSIS — Z01419 Encounter for gynecological examination (general) (routine) without abnormal findings: Secondary | ICD-10-CM | POA: Diagnosis not present

## 2017-08-22 DIAGNOSIS — J3089 Other allergic rhinitis: Secondary | ICD-10-CM | POA: Diagnosis not present

## 2017-09-08 ENCOUNTER — Ambulatory Visit (INDEPENDENT_AMBULATORY_CARE_PROVIDER_SITE_OTHER): Payer: BLUE CROSS/BLUE SHIELD | Admitting: *Deleted

## 2017-09-08 DIAGNOSIS — L821 Other seborrheic keratosis: Secondary | ICD-10-CM | POA: Diagnosis not present

## 2017-09-08 DIAGNOSIS — J309 Allergic rhinitis, unspecified: Secondary | ICD-10-CM | POA: Diagnosis not present

## 2017-09-08 DIAGNOSIS — L905 Scar conditions and fibrosis of skin: Secondary | ICD-10-CM | POA: Diagnosis not present

## 2017-09-08 DIAGNOSIS — L700 Acne vulgaris: Secondary | ICD-10-CM | POA: Diagnosis not present

## 2017-09-08 DIAGNOSIS — L814 Other melanin hyperpigmentation: Secondary | ICD-10-CM | POA: Diagnosis not present

## 2017-09-16 DIAGNOSIS — Z13820 Encounter for screening for osteoporosis: Secondary | ICD-10-CM | POA: Diagnosis not present

## 2017-09-16 DIAGNOSIS — R399 Unspecified symptoms and signs involving the genitourinary system: Secondary | ICD-10-CM | POA: Diagnosis not present

## 2017-09-16 DIAGNOSIS — N930 Postcoital and contact bleeding: Secondary | ICD-10-CM | POA: Diagnosis not present

## 2017-09-16 DIAGNOSIS — N950 Postmenopausal bleeding: Secondary | ICD-10-CM | POA: Diagnosis not present

## 2017-10-08 ENCOUNTER — Ambulatory Visit (INDEPENDENT_AMBULATORY_CARE_PROVIDER_SITE_OTHER): Payer: BLUE CROSS/BLUE SHIELD | Admitting: *Deleted

## 2017-10-08 DIAGNOSIS — J309 Allergic rhinitis, unspecified: Secondary | ICD-10-CM | POA: Diagnosis not present

## 2017-10-17 DIAGNOSIS — R102 Pelvic and perineal pain: Secondary | ICD-10-CM | POA: Diagnosis not present

## 2017-10-17 DIAGNOSIS — N3281 Overactive bladder: Secondary | ICD-10-CM | POA: Diagnosis not present

## 2017-10-27 DIAGNOSIS — N941 Unspecified dyspareunia: Secondary | ICD-10-CM | POA: Diagnosis not present

## 2017-11-04 DIAGNOSIS — M62838 Other muscle spasm: Secondary | ICD-10-CM | POA: Diagnosis not present

## 2017-11-04 DIAGNOSIS — R102 Pelvic and perineal pain: Secondary | ICD-10-CM | POA: Diagnosis not present

## 2017-11-04 DIAGNOSIS — M6281 Muscle weakness (generalized): Secondary | ICD-10-CM | POA: Diagnosis not present

## 2017-11-14 DIAGNOSIS — R350 Frequency of micturition: Secondary | ICD-10-CM | POA: Diagnosis not present

## 2017-11-14 DIAGNOSIS — M62838 Other muscle spasm: Secondary | ICD-10-CM | POA: Diagnosis not present

## 2017-11-14 DIAGNOSIS — R102 Pelvic and perineal pain: Secondary | ICD-10-CM | POA: Diagnosis not present

## 2017-11-14 DIAGNOSIS — M6281 Muscle weakness (generalized): Secondary | ICD-10-CM | POA: Diagnosis not present

## 2017-11-18 DIAGNOSIS — M6289 Other specified disorders of muscle: Secondary | ICD-10-CM | POA: Diagnosis not present

## 2017-11-18 DIAGNOSIS — M6281 Muscle weakness (generalized): Secondary | ICD-10-CM | POA: Diagnosis not present

## 2017-11-18 DIAGNOSIS — N3946 Mixed incontinence: Secondary | ICD-10-CM | POA: Diagnosis not present

## 2017-11-18 DIAGNOSIS — M62838 Other muscle spasm: Secondary | ICD-10-CM | POA: Diagnosis not present

## 2017-11-19 ENCOUNTER — Ambulatory Visit (INDEPENDENT_AMBULATORY_CARE_PROVIDER_SITE_OTHER): Payer: BLUE CROSS/BLUE SHIELD | Admitting: *Deleted

## 2017-11-19 DIAGNOSIS — J309 Allergic rhinitis, unspecified: Secondary | ICD-10-CM | POA: Diagnosis not present

## 2017-11-25 DIAGNOSIS — M6289 Other specified disorders of muscle: Secondary | ICD-10-CM | POA: Diagnosis not present

## 2017-11-25 DIAGNOSIS — M62838 Other muscle spasm: Secondary | ICD-10-CM | POA: Diagnosis not present

## 2017-11-25 DIAGNOSIS — M6281 Muscle weakness (generalized): Secondary | ICD-10-CM | POA: Diagnosis not present

## 2017-11-27 ENCOUNTER — Ambulatory Visit (INDEPENDENT_AMBULATORY_CARE_PROVIDER_SITE_OTHER): Payer: BLUE CROSS/BLUE SHIELD | Admitting: *Deleted

## 2017-11-27 DIAGNOSIS — J309 Allergic rhinitis, unspecified: Secondary | ICD-10-CM | POA: Diagnosis not present

## 2017-12-02 DIAGNOSIS — M62838 Other muscle spasm: Secondary | ICD-10-CM | POA: Diagnosis not present

## 2017-12-02 DIAGNOSIS — M6281 Muscle weakness (generalized): Secondary | ICD-10-CM | POA: Diagnosis not present

## 2017-12-04 ENCOUNTER — Ambulatory Visit (INDEPENDENT_AMBULATORY_CARE_PROVIDER_SITE_OTHER): Payer: BLUE CROSS/BLUE SHIELD | Admitting: *Deleted

## 2017-12-04 DIAGNOSIS — J309 Allergic rhinitis, unspecified: Secondary | ICD-10-CM | POA: Diagnosis not present

## 2017-12-09 DIAGNOSIS — R102 Pelvic and perineal pain: Secondary | ICD-10-CM | POA: Diagnosis not present

## 2017-12-09 DIAGNOSIS — M6289 Other specified disorders of muscle: Secondary | ICD-10-CM | POA: Diagnosis not present

## 2017-12-09 DIAGNOSIS — M62838 Other muscle spasm: Secondary | ICD-10-CM | POA: Diagnosis not present

## 2017-12-12 ENCOUNTER — Ambulatory Visit (INDEPENDENT_AMBULATORY_CARE_PROVIDER_SITE_OTHER): Payer: BLUE CROSS/BLUE SHIELD

## 2017-12-12 DIAGNOSIS — J309 Allergic rhinitis, unspecified: Secondary | ICD-10-CM | POA: Diagnosis not present

## 2017-12-16 DIAGNOSIS — M6281 Muscle weakness (generalized): Secondary | ICD-10-CM | POA: Diagnosis not present

## 2017-12-16 DIAGNOSIS — M6289 Other specified disorders of muscle: Secondary | ICD-10-CM | POA: Diagnosis not present

## 2017-12-16 DIAGNOSIS — M62838 Other muscle spasm: Secondary | ICD-10-CM | POA: Diagnosis not present

## 2017-12-16 DIAGNOSIS — R102 Pelvic and perineal pain: Secondary | ICD-10-CM | POA: Diagnosis not present

## 2017-12-18 ENCOUNTER — Ambulatory Visit (INDEPENDENT_AMBULATORY_CARE_PROVIDER_SITE_OTHER): Payer: BLUE CROSS/BLUE SHIELD | Admitting: *Deleted

## 2017-12-18 DIAGNOSIS — J309 Allergic rhinitis, unspecified: Secondary | ICD-10-CM | POA: Diagnosis not present

## 2017-12-23 DIAGNOSIS — M62838 Other muscle spasm: Secondary | ICD-10-CM | POA: Diagnosis not present

## 2017-12-23 DIAGNOSIS — R102 Pelvic and perineal pain: Secondary | ICD-10-CM | POA: Diagnosis not present

## 2017-12-23 DIAGNOSIS — M6281 Muscle weakness (generalized): Secondary | ICD-10-CM | POA: Diagnosis not present

## 2017-12-23 DIAGNOSIS — M6289 Other specified disorders of muscle: Secondary | ICD-10-CM | POA: Diagnosis not present

## 2017-12-29 DIAGNOSIS — M6281 Muscle weakness (generalized): Secondary | ICD-10-CM | POA: Diagnosis not present

## 2017-12-29 DIAGNOSIS — M62838 Other muscle spasm: Secondary | ICD-10-CM | POA: Diagnosis not present

## 2017-12-29 DIAGNOSIS — M6289 Other specified disorders of muscle: Secondary | ICD-10-CM | POA: Diagnosis not present

## 2017-12-29 DIAGNOSIS — R102 Pelvic and perineal pain: Secondary | ICD-10-CM | POA: Diagnosis not present

## 2018-01-06 DIAGNOSIS — M62838 Other muscle spasm: Secondary | ICD-10-CM | POA: Diagnosis not present

## 2018-01-06 DIAGNOSIS — M6281 Muscle weakness (generalized): Secondary | ICD-10-CM | POA: Diagnosis not present

## 2018-01-06 DIAGNOSIS — M6289 Other specified disorders of muscle: Secondary | ICD-10-CM | POA: Diagnosis not present

## 2018-01-06 DIAGNOSIS — R102 Pelvic and perineal pain: Secondary | ICD-10-CM | POA: Diagnosis not present

## 2018-01-16 ENCOUNTER — Ambulatory Visit (INDEPENDENT_AMBULATORY_CARE_PROVIDER_SITE_OTHER): Payer: BLUE CROSS/BLUE SHIELD | Admitting: *Deleted

## 2018-01-16 DIAGNOSIS — J309 Allergic rhinitis, unspecified: Secondary | ICD-10-CM | POA: Diagnosis not present

## 2018-01-20 DIAGNOSIS — R102 Pelvic and perineal pain: Secondary | ICD-10-CM | POA: Diagnosis not present

## 2018-01-20 DIAGNOSIS — M62838 Other muscle spasm: Secondary | ICD-10-CM | POA: Diagnosis not present

## 2018-01-20 DIAGNOSIS — M6281 Muscle weakness (generalized): Secondary | ICD-10-CM | POA: Diagnosis not present

## 2018-01-20 DIAGNOSIS — M6289 Other specified disorders of muscle: Secondary | ICD-10-CM | POA: Diagnosis not present

## 2018-01-27 DIAGNOSIS — M6289 Other specified disorders of muscle: Secondary | ICD-10-CM | POA: Diagnosis not present

## 2018-01-27 DIAGNOSIS — R102 Pelvic and perineal pain: Secondary | ICD-10-CM | POA: Diagnosis not present

## 2018-01-27 DIAGNOSIS — M6281 Muscle weakness (generalized): Secondary | ICD-10-CM | POA: Diagnosis not present

## 2018-01-27 DIAGNOSIS — M62838 Other muscle spasm: Secondary | ICD-10-CM | POA: Diagnosis not present

## 2018-02-02 DIAGNOSIS — R102 Pelvic and perineal pain: Secondary | ICD-10-CM | POA: Diagnosis not present

## 2018-02-02 DIAGNOSIS — M6281 Muscle weakness (generalized): Secondary | ICD-10-CM | POA: Diagnosis not present

## 2018-02-02 DIAGNOSIS — M6289 Other specified disorders of muscle: Secondary | ICD-10-CM | POA: Diagnosis not present

## 2018-02-02 DIAGNOSIS — M62838 Other muscle spasm: Secondary | ICD-10-CM | POA: Diagnosis not present

## 2018-02-13 ENCOUNTER — Other Ambulatory Visit: Payer: Self-pay | Admitting: Allergy & Immunology

## 2018-02-13 ENCOUNTER — Ambulatory Visit (INDEPENDENT_AMBULATORY_CARE_PROVIDER_SITE_OTHER): Payer: BLUE CROSS/BLUE SHIELD | Admitting: *Deleted

## 2018-02-13 DIAGNOSIS — M62838 Other muscle spasm: Secondary | ICD-10-CM | POA: Diagnosis not present

## 2018-02-13 DIAGNOSIS — R102 Pelvic and perineal pain: Secondary | ICD-10-CM | POA: Diagnosis not present

## 2018-02-13 DIAGNOSIS — M6281 Muscle weakness (generalized): Secondary | ICD-10-CM | POA: Diagnosis not present

## 2018-02-13 DIAGNOSIS — M6289 Other specified disorders of muscle: Secondary | ICD-10-CM | POA: Diagnosis not present

## 2018-02-13 DIAGNOSIS — J309 Allergic rhinitis, unspecified: Secondary | ICD-10-CM | POA: Diagnosis not present

## 2018-02-20 DIAGNOSIS — M62838 Other muscle spasm: Secondary | ICD-10-CM | POA: Diagnosis not present

## 2018-02-20 DIAGNOSIS — M6281 Muscle weakness (generalized): Secondary | ICD-10-CM | POA: Diagnosis not present

## 2018-02-20 DIAGNOSIS — R102 Pelvic and perineal pain: Secondary | ICD-10-CM | POA: Diagnosis not present

## 2018-02-20 DIAGNOSIS — M6289 Other specified disorders of muscle: Secondary | ICD-10-CM | POA: Diagnosis not present

## 2018-02-23 DIAGNOSIS — M6281 Muscle weakness (generalized): Secondary | ICD-10-CM | POA: Diagnosis not present

## 2018-02-23 DIAGNOSIS — M6289 Other specified disorders of muscle: Secondary | ICD-10-CM | POA: Diagnosis not present

## 2018-02-23 DIAGNOSIS — R102 Pelvic and perineal pain: Secondary | ICD-10-CM | POA: Diagnosis not present

## 2018-02-23 DIAGNOSIS — M62838 Other muscle spasm: Secondary | ICD-10-CM | POA: Diagnosis not present

## 2018-03-17 ENCOUNTER — Other Ambulatory Visit: Payer: Self-pay | Admitting: Allergy & Immunology

## 2018-03-17 DIAGNOSIS — M62838 Other muscle spasm: Secondary | ICD-10-CM | POA: Diagnosis not present

## 2018-03-17 DIAGNOSIS — M6281 Muscle weakness (generalized): Secondary | ICD-10-CM | POA: Diagnosis not present

## 2018-03-17 DIAGNOSIS — R102 Pelvic and perineal pain: Secondary | ICD-10-CM | POA: Diagnosis not present

## 2018-03-17 DIAGNOSIS — M6289 Other specified disorders of muscle: Secondary | ICD-10-CM | POA: Diagnosis not present

## 2018-03-19 ENCOUNTER — Ambulatory Visit (INDEPENDENT_AMBULATORY_CARE_PROVIDER_SITE_OTHER): Payer: BLUE CROSS/BLUE SHIELD | Admitting: *Deleted

## 2018-03-19 DIAGNOSIS — J309 Allergic rhinitis, unspecified: Secondary | ICD-10-CM | POA: Diagnosis not present

## 2018-03-26 DIAGNOSIS — Z1231 Encounter for screening mammogram for malignant neoplasm of breast: Secondary | ICD-10-CM | POA: Diagnosis not present

## 2018-03-27 DIAGNOSIS — M62838 Other muscle spasm: Secondary | ICD-10-CM | POA: Diagnosis not present

## 2018-03-27 DIAGNOSIS — M6281 Muscle weakness (generalized): Secondary | ICD-10-CM | POA: Diagnosis not present

## 2018-03-27 DIAGNOSIS — R102 Pelvic and perineal pain: Secondary | ICD-10-CM | POA: Diagnosis not present

## 2018-03-27 DIAGNOSIS — M6289 Other specified disorders of muscle: Secondary | ICD-10-CM | POA: Diagnosis not present

## 2018-04-01 DIAGNOSIS — J3089 Other allergic rhinitis: Secondary | ICD-10-CM | POA: Diagnosis not present

## 2018-04-01 NOTE — Progress Notes (Signed)
VIAL EXP 04-02-2019 

## 2018-04-03 DIAGNOSIS — M62838 Other muscle spasm: Secondary | ICD-10-CM | POA: Diagnosis not present

## 2018-04-03 DIAGNOSIS — M6281 Muscle weakness (generalized): Secondary | ICD-10-CM | POA: Diagnosis not present

## 2018-04-03 DIAGNOSIS — R102 Pelvic and perineal pain: Secondary | ICD-10-CM | POA: Diagnosis not present

## 2018-04-03 DIAGNOSIS — M6289 Other specified disorders of muscle: Secondary | ICD-10-CM | POA: Diagnosis not present

## 2018-04-07 ENCOUNTER — Encounter: Payer: Self-pay | Admitting: Allergy & Immunology

## 2018-04-07 ENCOUNTER — Ambulatory Visit: Payer: BLUE CROSS/BLUE SHIELD | Admitting: Allergy & Immunology

## 2018-04-07 VITALS — BP 112/80 | HR 65 | Resp 16 | Ht 61.0 in | Wt 140.0 lb

## 2018-04-07 DIAGNOSIS — J3089 Other allergic rhinitis: Secondary | ICD-10-CM

## 2018-04-07 DIAGNOSIS — M6281 Muscle weakness (generalized): Secondary | ICD-10-CM | POA: Diagnosis not present

## 2018-04-07 DIAGNOSIS — M62838 Other muscle spasm: Secondary | ICD-10-CM | POA: Diagnosis not present

## 2018-04-07 DIAGNOSIS — R102 Pelvic and perineal pain: Secondary | ICD-10-CM | POA: Diagnosis not present

## 2018-04-07 DIAGNOSIS — J4520 Mild intermittent asthma, uncomplicated: Secondary | ICD-10-CM

## 2018-04-07 DIAGNOSIS — R350 Frequency of micturition: Secondary | ICD-10-CM | POA: Diagnosis not present

## 2018-04-07 MED ORDER — ALBUTEROL SULFATE HFA 108 (90 BASE) MCG/ACT IN AERS: INHALATION_SPRAY | RESPIRATORY_TRACT | 1 refills | Status: DC

## 2018-04-07 MED ORDER — EPINEPHRINE 0.3 MG/0.3ML IJ SOAJ: 0.3000 mg | Freq: Once | INTRAMUSCULAR | 2 refills | Status: AC

## 2018-04-07 MED ORDER — BECLOMETHASONE DIPROP HFA 80 MCG/ACT IN AERB: 2.0000 | INHALATION_SPRAY | Freq: Two times a day (BID) | RESPIRATORY_TRACT | 5 refills | Status: DC

## 2018-04-07 MED ORDER — MONTELUKAST SODIUM 10 MG PO TABS: ORAL_TABLET | ORAL | 5 refills | Status: DC

## 2018-04-07 NOTE — Progress Notes (Signed)
FOLLOW UP  Date of Service/Encounter:  04/07/18   Assessment:   Mild intermittent asthma without complication  Perennial allergic rhinitis (cats, dogs, indoor and outdoor molds)   Ms. Williston presents for a follow-up visit.  While she is doing well from an asthma perspective, she continues to feel that she is not doing well from an allergic rhinitis perspective.  She is reaching 5 years on her maintenance dose of allergen immunotherapy in October 2020, but she is still needing her antihistamine 1-2 times a day.  She also continues to note worsening symptoms during high mold times of the year.  Review of her prescription for immunotherapy shows that her molds and dust mites are mixed with her cat and dog.  Molds especially are notable for containing protease is that breakdown other allergen extracts.  It is not clear why she did not have 2 injections instead of 1, but I think this might be contributing to her symptoms.  Before we remix her vials, we are going to retest her to see if she has possibly developed additional sensitizations.  We will see her in 1 week to do this testing and decide on a course of action at that time.   Plan/Recommendations:   1. Moderate persistent asthma, uncomplicated - Lung function looks fantastic today. - Daily controller medication(s): Singulair 10mg  daily - Rescue medications: ProAir 4 puffs every 4-6 hours as needed - Changes during respiratory infections or worsening symptoms: add on Qvar to 2 puffs twice daily for TWO WEEKS. - Asthma control goals:  * Full participation in all desired activities (may need albuterol before activity) * Albuterol use two time or less a week on average (not counting use with activity) * Cough interfering with sleep two time or less a month * Oral steroids no more than once a year * No hospitalizations  2. Gastroesophageal reflux disease - Continue with Nexium 40mg  twice daily. - Continue to follow up with GI.     3. Allergic rhinoconjunctivitis - I would like to re-test you to see if you have developed new sensitizations. - I think it would be good to at least divide the molds and dust mites from the other extracts in your vials.   4. Return in about 1 week (around 04/14/2018) for ALLERGY TESTING at 10:30 AM.  Subjective:   DENICIA MANZER is a 53 y.o. female presenting today for follow up of  Chief Complaint  Patient presents with  . Asthma    TAILYN MICHALSKI has a history of the following: Patient Active Problem List   Diagnosis Date Noted  . Gastroesophageal reflux disease 01/22/2017  . Perennial allergic rhinitis 01/22/2017  . Allergic rhinoconjunctivitis 11/19/2014  . Asthma 11/19/2014    History obtained from: chart review and patient.  Heinz Knuckles Naval's Primary Care Provider is Marden Noble, MD.     Daesia is a 53 y.o. female presenting for a follow up visit. Ms. Masood was last seen in December 2018.  At that time, we diagnosed her with an asthma exacerbation.  She had already received 1 course of azithromycin.  We did start her on a prednisone pack and gave her a Depo-Medrol injection.  We continued her on her daily regimen including Singulair 10 mg daily with Qvar added during respiratory flares.  Her reflux was controlled with Nexium daily.  She remained on Claritin and Rhinocort as needed.  Her allergic rhinitis symptoms have done well.  She remains at 0.5 mL of her  red vial, now spaced to every 4 weeks. She receives one injection. Immunotherapy script #1 contains molds, dust mites, cat and dog. She currently receives 0.5450mL of the RED vial (1/100). She started shots January of 2015 and reached maintenance in October of 2015, but did not reach 0.415mL of the [employer] Vial until March 2018.   Since the last visit, she has done very well. However, she does have some concerns about her atopic disease today.  Asthma/Respiratory Symptom History: She remains on the Qvar only as  needed. Cheynne's asthma has been well controlled. She has not required rescue medication, experienced nocturnal awakenings due to lower respiratory symptoms, nor have activities of daily living been limited. She has required no Emergency Department or Urgent Care visits for her asthma. She has required zero courses of systemic steroids for asthma exacerbations since the last visit. ACT score today is 21, indicating excellent asthma symptom control.   Allergic Rhinitis Symptom History: She does feel that she will get large local reactions occasionally and then the following month there is no reaction at all. This all depends on where the injection is given. She will take two Xyzal on the day of and after. She thinks that molds are the largest trigger for you. She does notice that she has more reactions when she comes in on rainy days, for instance. She does not use a nose spray daily. She will reach five years of maintenance in October 2020.  She tells me that she could be using her nasal spray more often than she is.  Otherwise, there have been no changes to her past medical history, surgical history, family history, or social history.    Review of Systems: a 14-point review of systems is pertinent for what is mentioned in HPI.  Otherwise, all other systems were negative.  Constitutional: negative other than that listed in the HPI Eyes: negative other than that listed in the HPI Ears, nose, mouth, throat, and face: negative other than that listed in the HPI Respiratory: negative other than that listed in the HPI Cardiovascular: negative other than that listed in the HPI Gastrointestinal: negative other than that listed in the HPI Genitourinary: negative other than that listed in the HPI Integument: negative other than that listed in the HPI Hematologic: negative other than that listed in the HPI Musculoskeletal: negative other than that listed in the HPI Neurological: negative other than that  listed in the HPI Allergy/Immunologic: negative other than that listed in the HPI    Objective:   Blood pressure 112/80, pulse 65, resp. rate 16, height 5\' 1"  (1.549 m), weight 140 lb (63.5 kg), SpO2 96 %. Body mass index is 26.45 kg/m.   Physical Exam:  General: Alert, interactive, in no acute distress.  Pleasant. Eyes: No conjunctival injection bilaterally, no discharge on the right, no discharge on the left and no Horner-Trantas dots present. PERRL bilaterally. EOMI without pain. No photophobia.  Ears: Right TM pearly gray with normal light reflex, Left TM pearly gray with normal light reflex, Right TM intact without perforation and Left TM intact without perforation.  Nose/Throat: External nose within normal limits and septum midline. Turbinates edematous and pale with clear discharge. Posterior oropharynx erythematous without cobblestoning in the posterior oropharynx. Tonsils 2+ without exudates.  Tongue without thrush. Lungs: Clear to auscultation without wheezing, rhonchi or rales. No increased work of breathing. CV: Normal S1/S2. No murmurs. Capillary refill <2 seconds.  Skin: Warm and dry, without lesions or rashes. Neuro:   Grossly  intact. No focal deficits appreciated. Responsive to questions.  Diagnostic studies:   Spirometry: results normal (FEV1: 2.69/95%, FVC: 3.32/92%, FEV1/FVC: 81%).    Spirometry consistent with normal pattern.   Allergy Studies: none      Malachi Bonds, MD  [facility]

## 2018-04-07 NOTE — Patient Instructions (Addendum)
1. Moderate persistent asthma, uncomplicated - Lung function looks fantastic today. - Daily controller medication(s): Singulair 10mg  daily - Rescue medications: ProAir 4 puffs every 4-6 hours as needed - Changes during respiratory infections or worsening symptoms: add on Qvar to 2 puffs twice daily for TWO WEEKS. - Asthma control goals:  * Full participation in all desired activities (may need albuterol before activity) * Albuterol use two time or less a week on average (not counting use with activity) * Cough interfering with sleep two time or less a month * Oral steroids no more than once a year * No hospitalizations  2. Gastroesophageal reflux disease - Continue with Nexium 40mg  twice daily. - Continue to follow up with GI.   3. Allergic rhinoconjunctivitis - I would like to re-test you to see if you have developed new sensitizations. - I think it would be good to at least divide the molds and dust mites from the other extracts in your vials.   4. Return in about 1 week (around 04/14/2018) for ALLERGY TESTING at 10:30 AM.   Please inform us of any Emergency Department visits, hospitalizations, or changes in symptoms. Call us before going to the ED for breathing or allergy symptoms since we might be able to fit you in for a sick visit. Feel free to contact us anytime with any questions, problems, or concerns.  It was a pleasure to see you again today!  Websites that have reliable patient information: 1. American Academy of Asthma, Allergy, and Immunology: www.aaaai.org 2. Food Allergy Research and Education (FARE): foodallergy.org 3. Mothers of Asthmatics: http://www.asthmacommunitynetwork.org 4. American College of Allergy, Asthma, and Immunology: www.acaai.org

## 2018-04-14 ENCOUNTER — Ambulatory Visit: Payer: BLUE CROSS/BLUE SHIELD | Admitting: Allergy & Immunology

## 2018-04-14 DIAGNOSIS — R102 Pelvic and perineal pain: Secondary | ICD-10-CM | POA: Diagnosis not present

## 2018-04-14 DIAGNOSIS — M62838 Other muscle spasm: Secondary | ICD-10-CM | POA: Diagnosis not present

## 2018-04-14 DIAGNOSIS — M6281 Muscle weakness (generalized): Secondary | ICD-10-CM | POA: Diagnosis not present

## 2018-04-14 DIAGNOSIS — M6289 Other specified disorders of muscle: Secondary | ICD-10-CM | POA: Diagnosis not present

## 2018-04-16 ENCOUNTER — Ambulatory Visit (INDEPENDENT_AMBULATORY_CARE_PROVIDER_SITE_OTHER): Payer: BLUE CROSS/BLUE SHIELD | Admitting: *Deleted

## 2018-04-16 DIAGNOSIS — J309 Allergic rhinitis, unspecified: Secondary | ICD-10-CM

## 2018-04-20 DIAGNOSIS — R102 Pelvic and perineal pain: Secondary | ICD-10-CM | POA: Diagnosis not present

## 2018-04-20 DIAGNOSIS — M62838 Other muscle spasm: Secondary | ICD-10-CM | POA: Diagnosis not present

## 2018-04-20 DIAGNOSIS — M6289 Other specified disorders of muscle: Secondary | ICD-10-CM | POA: Diagnosis not present

## 2018-04-20 DIAGNOSIS — M6281 Muscle weakness (generalized): Secondary | ICD-10-CM | POA: Diagnosis not present

## 2018-04-23 ENCOUNTER — Other Ambulatory Visit: Payer: Self-pay | Admitting: Obstetrics and Gynecology

## 2018-04-23 DIAGNOSIS — Z803 Family history of malignant neoplasm of breast: Secondary | ICD-10-CM

## 2018-04-28 DIAGNOSIS — M6289 Other specified disorders of muscle: Secondary | ICD-10-CM | POA: Diagnosis not present

## 2018-04-28 DIAGNOSIS — M62838 Other muscle spasm: Secondary | ICD-10-CM | POA: Diagnosis not present

## 2018-04-28 DIAGNOSIS — M6281 Muscle weakness (generalized): Secondary | ICD-10-CM | POA: Diagnosis not present

## 2018-04-28 DIAGNOSIS — R102 Pelvic and perineal pain: Secondary | ICD-10-CM | POA: Diagnosis not present

## 2018-05-04 DIAGNOSIS — M6289 Other specified disorders of muscle: Secondary | ICD-10-CM | POA: Diagnosis not present

## 2018-05-04 DIAGNOSIS — M62838 Other muscle spasm: Secondary | ICD-10-CM | POA: Diagnosis not present

## 2018-05-04 DIAGNOSIS — M6281 Muscle weakness (generalized): Secondary | ICD-10-CM | POA: Diagnosis not present

## 2018-05-04 DIAGNOSIS — R102 Pelvic and perineal pain: Secondary | ICD-10-CM | POA: Diagnosis not present

## 2018-05-11 ENCOUNTER — Other Ambulatory Visit: Payer: Self-pay | Admitting: Obstetrics and Gynecology

## 2018-05-11 ENCOUNTER — Ambulatory Visit
Admission: RE | Admit: 2018-05-11 | Discharge: 2018-05-11 | Disposition: A | Discharge status: Home / Self Care | Payer: BLUE CROSS/BLUE SHIELD | Source: Ambulatory Visit | Attending: Obstetrics and Gynecology | Admitting: Obstetrics and Gynecology

## 2018-05-11 DIAGNOSIS — Z803 Family history of malignant neoplasm of breast: Secondary | ICD-10-CM

## 2018-05-11 MED ORDER — GADOBUTROL 1 MMOL/ML IV SOLN
7.0000 mL | Freq: Once | INTRAVENOUS | Status: AC | PRN
  Administered 2018-05-11: 7 mL via INTRAVENOUS

## 2018-05-12 DIAGNOSIS — M6281 Muscle weakness (generalized): Secondary | ICD-10-CM | POA: Diagnosis not present

## 2018-05-12 DIAGNOSIS — M62838 Other muscle spasm: Secondary | ICD-10-CM | POA: Diagnosis not present

## 2018-05-12 DIAGNOSIS — M6289 Other specified disorders of muscle: Secondary | ICD-10-CM | POA: Diagnosis not present

## 2018-05-12 DIAGNOSIS — R102 Pelvic and perineal pain: Secondary | ICD-10-CM | POA: Diagnosis not present

## 2018-05-18 NOTE — Progress Notes (Signed)
Exp 05/19/19 

## 2018-05-19 ENCOUNTER — Ambulatory Visit: Payer: BLUE CROSS/BLUE SHIELD | Admitting: Allergy & Immunology

## 2018-05-19 ENCOUNTER — Encounter: Payer: Self-pay | Admitting: Allergy & Immunology

## 2018-05-19 VITALS — BP 110/68 | HR 68 | Temp 97.6°F | Resp 18 | Ht 60.7 in | Wt 135.4 lb

## 2018-05-19 DIAGNOSIS — J302 Other seasonal allergic rhinitis: Secondary | ICD-10-CM

## 2018-05-19 DIAGNOSIS — J4530 Mild persistent asthma, uncomplicated: Secondary | ICD-10-CM | POA: Diagnosis not present

## 2018-05-19 DIAGNOSIS — J3089 Other allergic rhinitis: Secondary | ICD-10-CM | POA: Diagnosis not present

## 2018-05-19 NOTE — Patient Instructions (Addendum)
1. Mild persistent asthma, uncomplicated - Lung function looks fantastic today. - We will not make any medication changes at this time.  - Daily controller medication(s): Singulair 10mg  daily - Rescue medications: ProAir 4 puffs every 4-6 hours as needed - Changes during respiratory infections or worsening symptoms: add on Qvar to 2 puffs twice daily for TWO WEEKS. - Asthma control goals:  * Full participation in all desired activities (may need albuterol before activity) * Albuterol use two time or less a week on average (not counting use with activity) * Cough interfering with sleep two time or less a month * Oral steroids no more than once a year * No hospitalizations  2. Gastroesophageal reflux disease - Continue with Nexium 40mg  twice daily. - Continue to follow up with GI.   3. Allergic rhinoconjunctivitis - Testing today showed: horse, trees, indoor molds, outdoor molds, cat and dog - Copy of test results provided.  - Avoidance measures provided. - We will remix your allergen vials and start from the beginning again unfortunately. - Continue with: Rhincort 1-2 sprays daily, Xyzal (levocetirizine) 5mg  tablet once daily, Singulair (montelukast) 10mg  daily and Astelin (azelastine) 2 sprays per nostril 1-2 times daily as needed - You can use an extra dose of the antihistamine, if needed, for breakthrough symptoms.  - Consider nasal saline rinses 1-2 times daily to remove allergens from the nasal cavities as well as help with mucous clearance (this is especially helpful to do before the nasal sprays are given) - Make an appointment in two weeks for the first injection.   3. Return in about 3 months (around 08/19/2018).   Please inform us of any Emergency Department visits, hospitalizations, or changes in symptoms. Call us before going to the ED for breathing or allergy symptoms since we might be able to fit you in for a sick visit. Feel free to contact us anytime with any  questions, problems, or concerns.  It was a pleasure to see you again today!  Websites that have reliable patient information: 1. American Academy of Asthma, Allergy, and Immunology: www.aaaai.org 2. Food Allergy Research and Education (FARE): foodallergy.org 3. Mothers of Asthmatics: http://www.asthmacommunitynetwork.org 4. American College of Allergy, Asthma, and Immunology: MissingWeapons.ca   Make sure you are registered to vote! If you have moved or changed any of your contact information, you will need to get this updated before voting!    Voter ID laws are NOT going into effect for the General Election in November 2020! DO NOT let this stop you from exercising your right to vote!

## 2018-05-19 NOTE — Progress Notes (Signed)
OLD RX FOR SI WERE NOT MADE SINCE SHE HAD NEW TESTING & NEW RX. NEW RX EXP 05-20-2019

## 2018-05-19 NOTE — Progress Notes (Signed)
FOLLOW UP  Date of Service/Encounter:  05/19/18   Assessment:   Mild intermittent asthma without complication  Perennial allergic rhinitis (cats, dogs, indoor and outdoor molds)  We did do testing today which reveals some new sensitizations and loss of old sensitization foods of dust mite.  She is interested in restarting her shots.  We will plan to separate the molds from the remainder of her allergens.  I am hopeful this will provide better control of her symptoms.   Plan/Recommendations:   1. Mild persistent asthma, uncomplicated - Lung function looks fantastic today. - We will not make any medication changes at this time.  - Daily controller medication(s): Singulair 10mg  daily - Rescue medications: ProAir 4 puffs every 4-6 hours as needed - Changes during respiratory infections or worsening symptoms: add on Qvar to 2 puffs twice daily for TWO WEEKS. - Asthma control goals:  * Full participation in all desired activities (may need albuterol before activity) * Albuterol use two time or less a week on average (not counting use with activity) * Cough interfering with sleep two time or less a month * Oral steroids no more than once a year * No hospitalizations  2. Gastroesophageal reflux disease - Continue with Nexium 40mg  twice daily. - Continue to follow up with GI.   3. Allergic rhinoconjunctivitis - Testing today showed: horse, trees, indoor molds, outdoor molds, cat and dog - Copy of test results provided.  - Avoidance measures provided. - We will remix your allergen vials and start from the beginning again unfortunately. - Continue with: Rhincort 1-2 sprays daily, Xyzal (levocetirizine) 5mg  tablet once daily, Singulair (montelukast) 10mg  daily and Astelin (azelastine) 2 sprays per nostril 1-2 times daily as needed - You can use an extra dose of the antihistamine, if needed, for breakthrough symptoms.  - Consider nasal saline rinses 1-2 times daily to remove  allergens from the nasal cavities as well as help with mucous clearance (this is especially helpful to do before the nasal sprays are given) - Make an appointment in two weeks for the first injection.   3. Return in about 3 months (around 08/19/2018).  Subjective:   Ariana Green is a 53 y.o. female presenting today for follow up of  Chief Complaint  Patient presents with  . Allergy Testing    retest    Ariana Green has a history of the following: Patient Active Problem List   Diagnosis Date Noted  . Gastroesophageal reflux disease 01/22/2017  . Perennial allergic rhinitis 01/22/2017  . Allergic rhinoconjunctivitis 11/19/2014  . Asthma 11/19/2014    History obtained from: chart review and patient.  Ariana Green is a 53 y.o. female presenting for skin testing.  She was last seen in January 2020.  At that time, she did not feel that her allergy shots were providing as good control.  We decided to remix her allergy vials, but before that we decided to retest her.  For her asthma, she was under good control with Singulair daily with pro-air as needed.  She was also continued on Nexium for her GERD.  Since last visit, she has done well.  She is scheduled to go to [place] at the end of the month, but she thinks that the trip will be delayed given the coronavirus.  She also has travel to [place] in [place] scheduled in the later spring and summer 2.  She is unsure of the fate of those.  Asthma/Respiratory Symptom History: She has been using her  Qvar on a more regular basis.  She decided that if she ended up getting coronavirus, she wanted to make sure that her baseline asthma was well controlled.  She also remains on the Singulair 10 mg daily.  She has had no prednisone since last visit.  Allergic Rhinitis Symptom History: She remains on her Xyzal nearly daily, and she doubles up on the day before her allergy shots. She remains at 0.5 mL of her red vial, now spaced to every 4 weeks. She  receives one injection. Immunotherapy script #1 contains molds, dust mites, cat and dog. She currently receives 0.6mL of the RED vial (1/100). She started shots January of 2015 and reached maintenance in October of 2015, but did not reach 0.52mL of the [employer] until March 2018.  She is not using a nasal spray on a daily basis.   Otherwise, there have been no changes to her past medical history, surgical history, family history, or social history.    Review of Systems  Constitutional: Negative.  Negative for fever, malaise/fatigue and weight loss.  HENT: Positive for congestion. Negative for ear discharge and ear pain.   Eyes: Negative for pain, discharge and redness.  Respiratory: Negative for cough, sputum production, shortness of breath and wheezing.   Cardiovascular: Negative.  Negative for chest pain and palpitations.  Gastrointestinal: Negative for abdominal pain and heartburn.  Skin: Negative.  Negative for itching and rash.  Neurological: Negative for dizziness and headaches.  Endo/Heme/Allergies: Positive for environmental allergies. Does not bruise/bleed easily.       Objective:   Blood pressure 110/68, pulse 68, temperature 97.6 F (36.4 C), temperature source Oral, resp. rate 18, height 5' 0.7" (1.542 m), weight 135 lb 6.4 oz (61.4 kg), SpO2 96 %. Body mass index is 25.84 kg/m.   Physical Exam:  Physical Exam  Constitutional: She appears well-developed.  HENT:  Head: Normocephalic and atraumatic.  Right Ear: Tympanic membrane, external ear and ear canal normal.  Left Ear: Tympanic membrane and ear canal normal.  Nose: No mucosal edema, rhinorrhea, nasal deformity or septal deviation. No epistaxis. Right sinus exhibits no maxillary sinus tenderness and no frontal sinus tenderness. Left sinus exhibits no maxillary sinus tenderness and no frontal sinus tenderness.  Mouth/Throat: Uvula is midline and oropharynx is clear and moist. Mucous membranes are not pale and not  dry.  Minimal rhinorrhea present bilaterally.  Eyes: Pupils are equal, round, and reactive to light. Conjunctivae and EOM are normal. Right eye exhibits no chemosis and no discharge. Left eye exhibits no chemosis and no discharge. Right conjunctiva is not injected. Left conjunctiva is not injected.  Cardiovascular: Normal rate, regular rhythm and normal heart sounds.  Respiratory: Effort normal and breath sounds normal. No accessory muscle usage. No tachypnea. No respiratory distress. She has no wheezes. She has no rhonchi. She has no rales. She exhibits no tenderness.  Moving air well in all lung fields.  Lymphadenopathy:    She has no cervical adenopathy.  Neurological: She is alert.  Skin: No abrasion, no petechiae and no rash noted. Rash is not papular, not vesicular and not urticarial. No erythema. No pallor.  Psychiatric: She has a normal mood and affect.     Diagnostic studies:    Spirometry: results normal (FEV1: 2.59/103%, FVC: 3.33/105%, FEV1/FVC: 78%).    Spirometry consistent with normal pattern.   Allergy Studies:    Airborne Adult Perc - 05/19/18 0948    Time Antigen Placed  0945    Allergen Manufacturer  Greer    Location  Back    Number of Test  59    1. Control-Buffer 50% Glycerol  Negative    2. Control-Histamine 1 mg/ml  Negative    3. Albumin saline  Negative    4. Bahia  Negative    5. French Southern Territories  Negative    6. Johnson  Negative    7. Kentucky Blue  Negative    8. Meadow Fescue  Negative    9. Perennial Rye  Negative    10. Sweet Vernal  Negative    11. Timothy  Negative    12. Cocklebur  Negative    13. Burweed Marshelder  Negative    14. Ragweed, short  Negative    15. Ragweed, Giant  Negative    16. Plantain,  English  Negative    17. Lamb's Quarters  Negative    18. Sheep Sorrell  Negative    19. Rough Pigweed  Negative    20. Marsh Elder, Rough  Negative    21. Mugwort, Common  Negative    22. Ash mix  Negative    23. Birch mix  Negative    24.  Beech American  Negative    25. Box, Elder  Negative    26. Cedar, red  Negative    27. Cottonwood, Guinea-Bissau  Negative    28. Elm mix  Negative    29. Hickory mix  Negative    30. Maple mix  Negative    31. Oak, Guinea-Bissau mix  Negative    32. Pecan Pollen  Negative    33. Pine mix  Negative    34. Sycamore Eastern  Negative    35. Walnut, Black Pollen  Negative    36. Alternaria alternata  Negative    37. Cladosporium Herbarum  3+    38. Aspergillus mix  Negative    39. Penicillium mix  Negative    40. Bipolaris sorokiniana (Helminthosporium)  Negative    41. Drechslera spicifera (Curvularia)  Negative    42. Mucor plumbeus  Negative    43. Fusarium moniliforme  Negative    44. Aureobasidium pullulans (pullulara)  Negative    45. Rhizopus oryzae  Negative    46. Botrytis cinera  Negative    47. Epicoccum nigrum  Negative    48. Phoma betae  Negative    49. Candida Albicans  Negative    50. Trichophyton mentagrophytes  Negative    51. Mite, D Farinae  5,000 AU/ml  Negative    52. Mite, D Pteronyssinus  5,000 AU/ml  Negative    53. Cat Hair 10,000 BAU/ml  Negative    54.  Dog Epithelia  Negative    55. Mixed Feathers  Negative    56. Horse Epithelia  2+    57. Cockroach, German  Negative    58. Mouse  Negative    59. Tobacco Leaf  Negative     Intradermal - 05/19/18 1004    Allergen Manufacturer  Waynette Buttery    Location  Arm    Number of Test  12    Control  Negative    French Southern Territories  Negative    Johnson  Negative    7 Grass  Negative    Ragweed mix  Negative    Weed mix  Negative    Tree mix  1+    Mold 2  2+    Mold 3  Negative    Mold 4  3+  Dog  3+    Cockroach  Negative    Mite mix  Negative       Allergy testing results were read and interpreted by myself, documented by clinical staff.      Malachi BondsJoel Sanel Stemmer, MD  [facility]

## 2018-05-21 DIAGNOSIS — J301 Allergic rhinitis due to pollen: Secondary | ICD-10-CM | POA: Diagnosis not present

## 2018-05-25 DIAGNOSIS — J3089 Other allergic rhinitis: Secondary | ICD-10-CM | POA: Diagnosis not present

## 2018-05-25 DIAGNOSIS — R102 Pelvic and perineal pain: Secondary | ICD-10-CM | POA: Diagnosis not present

## 2018-05-25 DIAGNOSIS — M6289 Other specified disorders of muscle: Secondary | ICD-10-CM | POA: Diagnosis not present

## 2018-05-25 DIAGNOSIS — M62838 Other muscle spasm: Secondary | ICD-10-CM | POA: Diagnosis not present

## 2018-05-25 DIAGNOSIS — M6281 Muscle weakness (generalized): Secondary | ICD-10-CM | POA: Diagnosis not present

## 2018-07-08 ENCOUNTER — Ambulatory Visit (INDEPENDENT_AMBULATORY_CARE_PROVIDER_SITE_OTHER): Payer: BLUE CROSS/BLUE SHIELD

## 2018-07-08 DIAGNOSIS — J309 Allergic rhinitis, unspecified: Secondary | ICD-10-CM

## 2018-07-08 NOTE — Progress Notes (Addendum)
Immunotherapy   Patient Details  Name: Ariana Green MRN: [id number] Date of Birth: [birth year]  07/08/2018  [name] started her new allergy injection set. Patient received 0.05 out of her blue vials. One with Tree-Cat-Dog-Horse and the other with Mold. Patient did not wait due to her already being on the injections for years. Patient did end up running an errand and coming back. She did not have any problems. Following schedule: B Frequency: 1-2 times weekly Epi-Pen: Yes Consent signed and patient instructions given.   Dub Mikes 07/08/2018, 3:19 PM

## 2018-07-21 ENCOUNTER — Ambulatory Visit (INDEPENDENT_AMBULATORY_CARE_PROVIDER_SITE_OTHER): Payer: BLUE CROSS/BLUE SHIELD

## 2018-07-21 DIAGNOSIS — J309 Allergic rhinitis, unspecified: Secondary | ICD-10-CM | POA: Diagnosis not present

## 2018-07-28 ENCOUNTER — Ambulatory Visit (INDEPENDENT_AMBULATORY_CARE_PROVIDER_SITE_OTHER): Payer: BLUE CROSS/BLUE SHIELD

## 2018-07-28 DIAGNOSIS — J309 Allergic rhinitis, unspecified: Secondary | ICD-10-CM | POA: Diagnosis not present

## 2018-08-06 ENCOUNTER — Ambulatory Visit (INDEPENDENT_AMBULATORY_CARE_PROVIDER_SITE_OTHER): Payer: BLUE CROSS/BLUE SHIELD

## 2018-08-06 DIAGNOSIS — J309 Allergic rhinitis, unspecified: Secondary | ICD-10-CM

## 2018-08-06 DIAGNOSIS — M62838 Other muscle spasm: Secondary | ICD-10-CM | POA: Diagnosis not present

## 2018-08-06 DIAGNOSIS — R102 Pelvic and perineal pain: Secondary | ICD-10-CM | POA: Diagnosis not present

## 2018-08-06 DIAGNOSIS — M6289 Other specified disorders of muscle: Secondary | ICD-10-CM | POA: Diagnosis not present

## 2018-08-06 DIAGNOSIS — M6281 Muscle weakness (generalized): Secondary | ICD-10-CM | POA: Diagnosis not present

## 2018-08-13 ENCOUNTER — Ambulatory Visit (INDEPENDENT_AMBULATORY_CARE_PROVIDER_SITE_OTHER): Payer: BLUE CROSS/BLUE SHIELD

## 2018-08-13 DIAGNOSIS — L710 Perioral dermatitis: Secondary | ICD-10-CM | POA: Diagnosis not present

## 2018-08-13 DIAGNOSIS — J309 Allergic rhinitis, unspecified: Secondary | ICD-10-CM | POA: Diagnosis not present

## 2018-08-13 DIAGNOSIS — M6281 Muscle weakness (generalized): Secondary | ICD-10-CM | POA: Diagnosis not present

## 2018-08-13 DIAGNOSIS — R102 Pelvic and perineal pain: Secondary | ICD-10-CM | POA: Diagnosis not present

## 2018-08-13 DIAGNOSIS — M6289 Other specified disorders of muscle: Secondary | ICD-10-CM | POA: Diagnosis not present

## 2018-08-13 DIAGNOSIS — M62838 Other muscle spasm: Secondary | ICD-10-CM | POA: Diagnosis not present

## 2018-08-13 DIAGNOSIS — L218 Other seborrheic dermatitis: Secondary | ICD-10-CM | POA: Diagnosis not present

## 2018-08-20 ENCOUNTER — Other Ambulatory Visit: Payer: Self-pay

## 2018-08-20 ENCOUNTER — Ambulatory Visit: Payer: BLUE CROSS/BLUE SHIELD | Admitting: Allergy & Immunology

## 2018-08-20 ENCOUNTER — Encounter: Payer: Self-pay | Admitting: Allergy & Immunology

## 2018-08-20 VITALS — BP 118/70 | HR 64 | Temp 98.5°F | Resp 18

## 2018-08-20 DIAGNOSIS — J4530 Mild persistent asthma, uncomplicated: Secondary | ICD-10-CM

## 2018-08-20 DIAGNOSIS — M62838 Other muscle spasm: Secondary | ICD-10-CM | POA: Diagnosis not present

## 2018-08-20 DIAGNOSIS — M6281 Muscle weakness (generalized): Secondary | ICD-10-CM | POA: Diagnosis not present

## 2018-08-20 DIAGNOSIS — J302 Other seasonal allergic rhinitis: Secondary | ICD-10-CM | POA: Diagnosis not present

## 2018-08-20 DIAGNOSIS — J3089 Other allergic rhinitis: Secondary | ICD-10-CM

## 2018-08-20 DIAGNOSIS — M6289 Other specified disorders of muscle: Secondary | ICD-10-CM | POA: Diagnosis not present

## 2018-08-20 DIAGNOSIS — K219 Gastro-esophageal reflux disease without esophagitis: Secondary | ICD-10-CM

## 2018-08-20 DIAGNOSIS — R102 Pelvic and perineal pain: Secondary | ICD-10-CM | POA: Diagnosis not present

## 2018-08-20 NOTE — Patient Instructions (Addendum)
1. Mild persistent asthma, uncomplicated - Lung function deferred today since it can spread the coronavirus.  - We will not make any medication changes at this time.  - Daily controller medication(s): Singulair 10mg  daily - Rescue medications: ProAir 4 puffs every 4-6 hours as needed - Changes during respiratory infections or worsening symptoms: add on Qvar 74mcg to 2 puffs twice daily for TWO WEEKS. - Asthma control goals:  * Full participation in all desired activities (may need albuterol before activity) * Albuterol use two time or less a week on average (not counting use with activity) * Cough interfering with sleep two time or less a month * Oral steroids no more than once a year * No hospitalizations  2. Gastroesophageal reflux disease - Continue with Nexium 40mg  twice daily. - Continue to follow up with GI.   3. Allergic rhinoconjunctivitis (horse, trees, indoor molds, outdoor molds, cat and dog) - Continue with allergy shots at the same schedule.  - Continue with: Rhincort 1-2 sprays daily, Xyzal (levocetirizine) 5mg  tablet once daily, Singulair (montelukast) 10mg  daily and Astelin (azelastine) 2 sprays per nostril 1-2 times daily as needed - You can use an extra dose of the antihistamine, if needed, for breakthrough symptoms.  - Consider nasal saline rinses 1-2 times daily to remove allergens from the nasal cavities as well as help with mucous clearance (this is especially helpful to do before the nasal sprays are given)   3. Return in about 6 months (around 02/19/2019).   Please inform us of any Emergency Department visits, hospitalizations, or changes in symptoms. Call us before going to the ED for breathing or allergy symptoms since we might be able to fit you in for a sick visit. Feel free to contact us anytime with any questions, problems, or concerns.  It was a pleasure to see you again today!  Websites that have reliable patient information: 1. American Academy of  Asthma, Allergy, and Immunology: www.aaaai.org 2. Food Allergy Research and Education (FARE): foodallergy.org 3. Mothers of Asthmatics: http://www.asthmacommunitynetwork.org 4. American College of Allergy, Asthma, and Immunology: MonthlyElectricBill.co.uk   Make sure you are registered to vote! If you have moved or changed any of your contact information, you will need to get this updated before voting!    Voter ID laws are NOT going into effect for the General Election in November 2020! DO NOT let this stop you from exercising your right to vote!

## 2018-08-20 NOTE — Progress Notes (Signed)
FOLLOW UP  Date of Service/Encounter:  08/20/18   Assessment:   Mild intermittent asthma without complication  Perennial allergic rhinitis(cats, dogs, indoor and outdoor molds)   Ariana Green presents for follow-up visit.  She is doing remarkably well on her current regimen.  She did have 2 to 3 weeks of worsening asthma symptoms in April, but she was able to control it with Qvar and albuterol at home.  She did not need prednisone or emergency room visits.  Her allergic rhinitis is fairly stable.  She did stop her allergy shots for a period of 6 to 8 weeks during the coronavirus pandemic, but has restarted them.  She is almost finished with her blue vial and tolerating all of these well.  She remains on all of her nasal sprays as well as Xyzal and Singulair.    Plan/Recommendations:   1. Mild persistent asthma, uncomplicated - Lung function deferred today since it can spread the coronavirus.  - We will not make any medication changes at this time.  - Daily controller medication(s): Singulair 10mg  daily - Rescue medications: ProAir 4 puffs every 4-6 hours as needed - Changes during respiratory infections or worsening symptoms: add on Qvar 30mcg to 2 puffs twice daily for TWO WEEKS. - Asthma control goals:  * Full participation in all desired activities (may need albuterol before activity) * Albuterol use two time or less a week on average (not counting use with activity) * Cough interfering with sleep two time or less a month * Oral steroids no more than once a year * No hospitalizations  2. Gastroesophageal reflux disease - Continue with Nexium 40mg  twice daily. - Continue to follow up with GI.   3. Allergic rhinoconjunctivitis (horse, trees, indoor molds, outdoor molds, cat and dog) - Continue with allergy shots at the same schedule.  - Continue with: Rhincort 1-2 sprays daily, Xyzal (levocetirizine) 5mg  tablet once daily, Singulair (montelukast) 10mg  daily and Astelin  (azelastine) 2 sprays per nostril 1-2 times daily as needed - You can use an extra dose of the antihistamine, if needed, for breakthrough symptoms.  - Consider nasal saline rinses 1-2 times daily to remove allergens from the nasal cavities as well as help with mucous clearance (this is especially helpful to do before the nasal sprays are given)   3. Return in about 6 months (around 02/19/2019).    Subjective:   Ariana Green is a 53 y.o. female presenting today for follow up of  Chief Complaint  Patient presents with  . Allergic Rhinitis   . Asthma    Ariana Green has a history of the following: Patient Active Problem List   Diagnosis Date Noted  . Gastroesophageal reflux disease 01/22/2017  . Seasonal and perennial allergic rhinitis 01/22/2017  . Allergic rhinoconjunctivitis 11/19/2014  . Asthma 11/19/2014    History obtained from: chart review and patient.  Ariana Green is a 53 y.o. female presenting for a follow up visit.  We last saw her in March 2020.  At that time, we repeated her skin testing because she was not having great control with her allergy shots.  She had testing that was positive to horse, trees, indoor and outdoor molds, cat, and dog.  Some of these were new, so we did remix her allergen vials.  We continued with Rhinocort 1 to 2 sprays per nostril daily, Xyzal 5 mg daily, Singulair 10 mg daily, and Astelin 2 sprays per nostril up to twice daily.  At the last visit, she  had her trip to [place] delayed due to the coronavirus.  She also had a trip to [place] scheduled as well.  Since last visit, she has remained fairly stable.  She did end up making her college-age kids come back home during the coronavirus pandemic.  They were home for approximately 2 months.  She was very stringent about going out and paid attention to the stay at home orders.  Her kids have both returned to school once again.  Asthma/Respiratory Symptom History: She did have a period of  3 to 4 weeks in April where her asthma was "out of control".  She started her Qvar 2 puffs twice daily and continue this for 2 to 3 weeks before decreasing down to 2 puffs once daily.  She is now taking a puff every other day.  Her albuterol use has decreased with this and she has not needed any prednisone.  She has not needed any emergency room visits for her symptoms.  She reports fairly good sleep.  Allergic Rhinitis Symptom History: She remains on all of her allergy medications.  She did stop her shots for period of 6 weeks or so during the coronavirus pandemic.  She has now restarted them and has tolerated her advance without any adverse reactions.  She is going to complete her blue vial today.  Otherwise, there have been no changes to her past medical history, surgical history, family history, or social history.    Review of Systems  Constitutional: Negative.  Negative for chills, fever, malaise/fatigue and weight loss.  HENT: Negative.  Negative for congestion, ear discharge and ear pain.   Eyes: Negative for pain, discharge and redness.  Respiratory: Positive for cough. Negative for sputum production, shortness of breath and wheezing.   Cardiovascular: Negative.  Negative for chest pain and palpitations.  Gastrointestinal: Negative for abdominal pain, heartburn, nausea and vomiting.  Skin: Negative.  Negative for itching and rash.  Neurological: Negative for dizziness and headaches.  Endo/Heme/Allergies: Negative for environmental allergies. Does not bruise/bleed easily.       Objective:   Blood pressure 118/70, pulse 64, temperature 98.5 F (36.9 C), temperature source Oral, resp. rate 18, SpO2 95 %. There is no height or weight on file to calculate BMI.   Physical Exam:  Physical Exam  Constitutional: She appears well-developed.  Pleasant female.  Talkative.  Wearing a mask.  HENT:  Head: Normocephalic and atraumatic.  Right Ear: Tympanic membrane, external ear and ear  canal normal.  Left Ear: Tympanic membrane, external ear and ear canal normal.  Nose: Mucosal edema and rhinorrhea present. No nasal deformity or septal deviation. No epistaxis. Right sinus exhibits no maxillary sinus tenderness and no frontal sinus tenderness. Left sinus exhibits no maxillary sinus tenderness and no frontal sinus tenderness.  Mouth/Throat: Uvula is midline and oropharynx is clear and moist. Mucous membranes are not pale and not dry.  Minimal postnasal drip noted.  Eyes: Pupils are equal, round, and reactive to light. Conjunctivae and EOM are normal. Right eye exhibits no chemosis and no discharge. Left eye exhibits no chemosis and no discharge. Right conjunctiva is not injected. Left conjunctiva is not injected.  Cardiovascular: Normal rate, regular rhythm and normal heart sounds.  Respiratory: Effort normal and breath sounds normal. No accessory muscle usage. No tachypnea. No respiratory distress. She has no wheezes. She has no rhonchi. She has no rales. She exhibits no tenderness.  Moving air well in all lung fields.  Lymphadenopathy:    She  has no cervical adenopathy.  Neurological: She is alert.  Skin: No abrasion, no petechiae and no rash noted. Rash is not papular, not vesicular and not urticarial. No erythema. No pallor.  No urticarial or eczematous lesions noted.  Psychiatric: She has a normal mood and affect.     Diagnostic studies: none      Malachi BondsJoel Rolf Fells, MD  [facility]

## 2018-08-26 DIAGNOSIS — Z01419 Encounter for gynecological examination (general) (routine) without abnormal findings: Secondary | ICD-10-CM | POA: Diagnosis not present

## 2018-08-26 DIAGNOSIS — Z6825 Body mass index (BMI) 25.0-25.9, adult: Secondary | ICD-10-CM | POA: Diagnosis not present

## 2018-09-01 ENCOUNTER — Ambulatory Visit (INDEPENDENT_AMBULATORY_CARE_PROVIDER_SITE_OTHER): Payer: BC Managed Care – PPO | Admitting: *Deleted

## 2018-09-01 DIAGNOSIS — J309 Allergic rhinitis, unspecified: Secondary | ICD-10-CM | POA: Diagnosis not present

## 2018-09-01 DIAGNOSIS — M62838 Other muscle spasm: Secondary | ICD-10-CM | POA: Diagnosis not present

## 2018-09-01 DIAGNOSIS — R102 Pelvic and perineal pain: Secondary | ICD-10-CM | POA: Diagnosis not present

## 2018-09-01 DIAGNOSIS — M6289 Other specified disorders of muscle: Secondary | ICD-10-CM | POA: Diagnosis not present

## 2018-09-01 DIAGNOSIS — M6281 Muscle weakness (generalized): Secondary | ICD-10-CM | POA: Diagnosis not present

## 2018-09-09 DIAGNOSIS — R1900 Intra-abdominal and pelvic swelling, mass and lump, unspecified site: Secondary | ICD-10-CM | POA: Diagnosis not present

## 2018-09-10 ENCOUNTER — Ambulatory Visit (INDEPENDENT_AMBULATORY_CARE_PROVIDER_SITE_OTHER): Payer: BC Managed Care – PPO | Admitting: *Deleted

## 2018-09-10 DIAGNOSIS — J309 Allergic rhinitis, unspecified: Secondary | ICD-10-CM | POA: Diagnosis not present

## 2018-09-14 ENCOUNTER — Ambulatory Visit (INDEPENDENT_AMBULATORY_CARE_PROVIDER_SITE_OTHER): Payer: BC Managed Care – PPO | Admitting: *Deleted

## 2018-09-14 DIAGNOSIS — R102 Pelvic and perineal pain: Secondary | ICD-10-CM | POA: Diagnosis not present

## 2018-09-14 DIAGNOSIS — J309 Allergic rhinitis, unspecified: Secondary | ICD-10-CM

## 2018-09-14 DIAGNOSIS — M6289 Other specified disorders of muscle: Secondary | ICD-10-CM | POA: Diagnosis not present

## 2018-09-14 DIAGNOSIS — M6281 Muscle weakness (generalized): Secondary | ICD-10-CM | POA: Diagnosis not present

## 2018-09-14 DIAGNOSIS — M62838 Other muscle spasm: Secondary | ICD-10-CM | POA: Diagnosis not present

## 2018-09-16 ENCOUNTER — Other Ambulatory Visit: Payer: Self-pay | Admitting: Obstetrics and Gynecology

## 2018-09-16 DIAGNOSIS — R1900 Intra-abdominal and pelvic swelling, mass and lump, unspecified site: Secondary | ICD-10-CM

## 2018-09-21 ENCOUNTER — Ambulatory Visit (INDEPENDENT_AMBULATORY_CARE_PROVIDER_SITE_OTHER): Payer: BC Managed Care – PPO

## 2018-09-21 DIAGNOSIS — J309 Allergic rhinitis, unspecified: Secondary | ICD-10-CM

## 2018-09-21 DIAGNOSIS — D225 Melanocytic nevi of trunk: Secondary | ICD-10-CM | POA: Diagnosis not present

## 2018-09-21 DIAGNOSIS — L710 Perioral dermatitis: Secondary | ICD-10-CM | POA: Diagnosis not present

## 2018-09-21 DIAGNOSIS — D1801 Hemangioma of skin and subcutaneous tissue: Secondary | ICD-10-CM | POA: Diagnosis not present

## 2018-09-21 DIAGNOSIS — L821 Other seborrheic keratosis: Secondary | ICD-10-CM | POA: Diagnosis not present

## 2018-09-21 DIAGNOSIS — B078 Other viral warts: Secondary | ICD-10-CM | POA: Diagnosis not present

## 2018-09-22 ENCOUNTER — Other Ambulatory Visit: Payer: Self-pay | Admitting: Allergy & Immunology

## 2018-09-29 ENCOUNTER — Ambulatory Visit (INDEPENDENT_AMBULATORY_CARE_PROVIDER_SITE_OTHER): Payer: BC Managed Care – PPO

## 2018-09-29 DIAGNOSIS — J3089 Other allergic rhinitis: Secondary | ICD-10-CM | POA: Diagnosis not present

## 2018-09-29 DIAGNOSIS — J302 Other seasonal allergic rhinitis: Secondary | ICD-10-CM | POA: Diagnosis not present

## 2018-10-01 DIAGNOSIS — R102 Pelvic and perineal pain: Secondary | ICD-10-CM | POA: Diagnosis not present

## 2018-10-01 DIAGNOSIS — M6281 Muscle weakness (generalized): Secondary | ICD-10-CM | POA: Diagnosis not present

## 2018-10-01 DIAGNOSIS — M62838 Other muscle spasm: Secondary | ICD-10-CM | POA: Diagnosis not present

## 2018-10-01 DIAGNOSIS — M6289 Other specified disorders of muscle: Secondary | ICD-10-CM | POA: Diagnosis not present

## 2018-10-06 ENCOUNTER — Ambulatory Visit (INDEPENDENT_AMBULATORY_CARE_PROVIDER_SITE_OTHER): Payer: BC Managed Care – PPO

## 2018-10-06 DIAGNOSIS — J309 Allergic rhinitis, unspecified: Secondary | ICD-10-CM | POA: Diagnosis not present

## 2018-10-12 ENCOUNTER — Ambulatory Visit (INDEPENDENT_AMBULATORY_CARE_PROVIDER_SITE_OTHER): Payer: BC Managed Care – PPO | Admitting: *Deleted

## 2018-10-12 DIAGNOSIS — J309 Allergic rhinitis, unspecified: Secondary | ICD-10-CM | POA: Diagnosis not present

## 2018-10-13 ENCOUNTER — Other Ambulatory Visit: Payer: Self-pay | Admitting: Neurosurgery

## 2018-10-13 ENCOUNTER — Other Ambulatory Visit: Payer: Self-pay | Admitting: Obstetrics and Gynecology

## 2018-10-13 DIAGNOSIS — R1900 Intra-abdominal and pelvic swelling, mass and lump, unspecified site: Secondary | ICD-10-CM

## 2018-10-15 ENCOUNTER — Other Ambulatory Visit: Payer: BLUE CROSS/BLUE SHIELD

## 2018-10-19 DIAGNOSIS — Z13220 Encounter for screening for lipoid disorders: Secondary | ICD-10-CM | POA: Diagnosis not present

## 2018-10-19 DIAGNOSIS — Z0001 Encounter for general adult medical examination with abnormal findings: Secondary | ICD-10-CM | POA: Diagnosis not present

## 2018-10-19 DIAGNOSIS — D6859 Other primary thrombophilia: Secondary | ICD-10-CM | POA: Diagnosis not present

## 2018-10-19 DIAGNOSIS — E559 Vitamin D deficiency, unspecified: Secondary | ICD-10-CM | POA: Diagnosis not present

## 2018-10-19 DIAGNOSIS — J45909 Unspecified asthma, uncomplicated: Secondary | ICD-10-CM | POA: Diagnosis not present

## 2018-10-19 DIAGNOSIS — K219 Gastro-esophageal reflux disease without esophagitis: Secondary | ICD-10-CM | POA: Diagnosis not present

## 2018-10-19 DIAGNOSIS — M189 Osteoarthritis of first carpometacarpal joint, unspecified: Secondary | ICD-10-CM | POA: Diagnosis not present

## 2018-10-19 DIAGNOSIS — K589 Irritable bowel syndrome without diarrhea: Secondary | ICD-10-CM | POA: Diagnosis not present

## 2018-10-19 DIAGNOSIS — Z23 Encounter for immunization: Secondary | ICD-10-CM | POA: Diagnosis not present

## 2018-10-19 DIAGNOSIS — J309 Allergic rhinitis, unspecified: Secondary | ICD-10-CM | POA: Diagnosis not present

## 2018-10-19 DIAGNOSIS — Z79899 Other long term (current) drug therapy: Secondary | ICD-10-CM | POA: Diagnosis not present

## 2018-10-22 ENCOUNTER — Other Ambulatory Visit: Payer: Self-pay

## 2018-10-26 ENCOUNTER — Ambulatory Visit (INDEPENDENT_AMBULATORY_CARE_PROVIDER_SITE_OTHER): Payer: BC Managed Care – PPO | Admitting: *Deleted

## 2018-10-26 DIAGNOSIS — J309 Allergic rhinitis, unspecified: Secondary | ICD-10-CM | POA: Diagnosis not present

## 2018-10-29 ENCOUNTER — Ambulatory Visit
Admission: RE | Admit: 2018-10-29 | Discharge: 2018-10-29 | Disposition: A | Discharge status: Home / Self Care | Payer: BC Managed Care – PPO | Source: Ambulatory Visit | Attending: Obstetrics and Gynecology | Admitting: Obstetrics and Gynecology

## 2018-10-29 ENCOUNTER — Other Ambulatory Visit: Payer: Self-pay

## 2018-10-29 DIAGNOSIS — R102 Pelvic and perineal pain: Secondary | ICD-10-CM | POA: Diagnosis not present

## 2018-10-29 DIAGNOSIS — M6281 Muscle weakness (generalized): Secondary | ICD-10-CM | POA: Diagnosis not present

## 2018-10-29 DIAGNOSIS — K7689 Other specified diseases of liver: Secondary | ICD-10-CM | POA: Diagnosis not present

## 2018-10-29 DIAGNOSIS — M62838 Other muscle spasm: Secondary | ICD-10-CM | POA: Diagnosis not present

## 2018-10-29 DIAGNOSIS — M6289 Other specified disorders of muscle: Secondary | ICD-10-CM | POA: Diagnosis not present

## 2018-10-29 DIAGNOSIS — R1900 Intra-abdominal and pelvic swelling, mass and lump, unspecified site: Secondary | ICD-10-CM

## 2018-10-29 MED ORDER — IOPAMIDOL (ISOVUE-300) INJECTION 61%
100.0000 mL | Freq: Once | INTRAVENOUS | Status: AC | PRN
  Administered 2018-10-29: 100 mL via INTRAVENOUS

## 2018-11-05 ENCOUNTER — Ambulatory Visit (INDEPENDENT_AMBULATORY_CARE_PROVIDER_SITE_OTHER): Payer: BC Managed Care – PPO | Admitting: *Deleted

## 2018-11-05 DIAGNOSIS — J309 Allergic rhinitis, unspecified: Secondary | ICD-10-CM

## 2018-11-12 ENCOUNTER — Ambulatory Visit (INDEPENDENT_AMBULATORY_CARE_PROVIDER_SITE_OTHER): Payer: BC Managed Care – PPO | Admitting: *Deleted

## 2018-11-12 DIAGNOSIS — J309 Allergic rhinitis, unspecified: Secondary | ICD-10-CM | POA: Diagnosis not present

## 2018-11-20 ENCOUNTER — Ambulatory Visit (INDEPENDENT_AMBULATORY_CARE_PROVIDER_SITE_OTHER): Payer: BC Managed Care – PPO

## 2018-11-20 DIAGNOSIS — J309 Allergic rhinitis, unspecified: Secondary | ICD-10-CM | POA: Diagnosis not present

## 2018-12-04 ENCOUNTER — Ambulatory Visit (INDEPENDENT_AMBULATORY_CARE_PROVIDER_SITE_OTHER): Payer: BC Managed Care – PPO

## 2018-12-04 DIAGNOSIS — J309 Allergic rhinitis, unspecified: Secondary | ICD-10-CM | POA: Diagnosis not present

## 2018-12-10 ENCOUNTER — Ambulatory Visit (INDEPENDENT_AMBULATORY_CARE_PROVIDER_SITE_OTHER): Payer: BC Managed Care – PPO | Admitting: *Deleted

## 2018-12-10 DIAGNOSIS — M6289 Other specified disorders of muscle: Secondary | ICD-10-CM | POA: Diagnosis not present

## 2018-12-10 DIAGNOSIS — M6281 Muscle weakness (generalized): Secondary | ICD-10-CM | POA: Diagnosis not present

## 2018-12-10 DIAGNOSIS — M62838 Other muscle spasm: Secondary | ICD-10-CM | POA: Diagnosis not present

## 2018-12-10 DIAGNOSIS — J309 Allergic rhinitis, unspecified: Secondary | ICD-10-CM | POA: Diagnosis not present

## 2018-12-10 DIAGNOSIS — R102 Pelvic and perineal pain: Secondary | ICD-10-CM | POA: Diagnosis not present

## 2018-12-16 ENCOUNTER — Ambulatory Visit (INDEPENDENT_AMBULATORY_CARE_PROVIDER_SITE_OTHER): Payer: BC Managed Care – PPO | Admitting: *Deleted

## 2018-12-16 DIAGNOSIS — J309 Allergic rhinitis, unspecified: Secondary | ICD-10-CM | POA: Diagnosis not present

## 2018-12-22 ENCOUNTER — Ambulatory Visit (INDEPENDENT_AMBULATORY_CARE_PROVIDER_SITE_OTHER): Payer: BC Managed Care – PPO | Admitting: *Deleted

## 2018-12-22 DIAGNOSIS — J309 Allergic rhinitis, unspecified: Secondary | ICD-10-CM | POA: Diagnosis not present

## 2018-12-29 ENCOUNTER — Other Ambulatory Visit: Payer: Self-pay | Admitting: Sports Medicine

## 2018-12-29 ENCOUNTER — Ambulatory Visit (INDEPENDENT_AMBULATORY_CARE_PROVIDER_SITE_OTHER): Payer: BC Managed Care – PPO

## 2018-12-29 ENCOUNTER — Encounter: Payer: Self-pay | Admitting: Sports Medicine

## 2018-12-29 ENCOUNTER — Ambulatory Visit: Payer: BC Managed Care – PPO | Admitting: Sports Medicine

## 2018-12-29 ENCOUNTER — Other Ambulatory Visit: Payer: Self-pay

## 2018-12-29 VITALS — BP 117/70 | HR 67 | Resp 16

## 2018-12-29 DIAGNOSIS — M79672 Pain in left foot: Secondary | ICD-10-CM | POA: Diagnosis not present

## 2018-12-29 DIAGNOSIS — M722 Plantar fascial fibromatosis: Secondary | ICD-10-CM

## 2018-12-29 NOTE — Progress Notes (Signed)
Subjective: Ariana Green is a 53 y.o. female patient presents to office with complaint of soft tissue mass at the plantar left greater than right foot reports that when she presses the area and when she does a lot of standing and walking can feel it reports that pain is worse sometimes after she has done a lot of stretching or after playing tennis reports that she noticed this area since March but over the last few weeks slowly has gotten worse reports that she is very active with tennis and reports that she has been using her night splint icing gentle massage inserts without any improvement.  Patient denies any known trauma or injury.  No other pedal complaints noted.  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  Review of Systems  All other systems reviewed and are negative.   Patient Active Problem List   Diagnosis Date Noted  . Gastroesophageal reflux disease 01/22/2017  . Seasonal and perennial allergic rhinitis 01/22/2017  . Allergic rhinoconjunctivitis 11/19/2014  . Asthma 11/19/2014    Current Outpatient Medications on File Prior to Visit  Medication Sig Dispense Refill  . albuterol (PROAIR HFA) 108 (90 Base) MCG/ACT inhaler INHALE 2 PUFFS BY MOUTH INTO THE LUNGS EVERY 4 HOURS AS NEEDED FOR WHEEZING OR SHORTNESS OF BREATH 8.5 g 1  . beclomethasone (QVAR REDIHALER) 80 MCG/ACT inhaler Inhale 2 puffs into the lungs 2 (two) times daily. (Patient taking differently: Inhale 1 puff into the lungs daily. ) 10.6 g 5  . budesonide (RHINOCORT ALLERGY) 32 MCG/ACT nasal spray Place 1 spray into both nostrils daily  as needed for rhinitis.    . celecoxib (CELEBREX) 200 MG capsule TK 1 C PO QD PRF JOINT PAIN    . clobetasol cream (TEMOVATE) 0.05 % APP AA ONE TIME PER DAY    . erythromycin with ethanol (EMGEL) 2 % gel APP 1 APPLICATION TO FACE BID UNTIL CLEAR    . esomeprazole (NEXIUM) 40 MG capsule Take 40 mg by mouth daily.  1  . Estradiol 10 MCG TABS vaginal tablet I 1 T INTRAVAGINALLY 2 TIMES A WK    . Fish Oil OIL Take 1,200 mg by mouth daily.     . montelukast (SINGULAIR) 10 MG tablet TAKE 1 TABLET(10 MG) BY MOUTH AT BEDTIME 30 tablet 5  . Multiple Vitamin (MULTIVITAMIN) tablet Take 1 tablet by mouth daily.    . NON FORMULARY [facility] Scar Cream: Verapamil 10% Pentoxifylline 5% 100 gm  Pt to use at Plantar Fibromas Bilateral    . spironolactone (ALDACTONE) 50 MG tablet TK 1 T PO D  2  . UNABLE TO FIND TUMRIC 1000MG  DAILY     No current facility-administered medications on file prior to visit.     Allergies  Allergen Reactions  . Erythromycin Base   . Doxycycline Rash    Objective: Physical Exam General: The patient is alert and oriented x3 in no acute distress.  Dermatology: Skin is warm, dry and supple bilateral lower extremities. Nails 1-10 are normal. There is no erythema, edema,  no eccymosis, no open lesions present.  Raised mobile soft tissue mass at the plantar distal arch left greater than right measuring less than half a centimeter consistent with plantar fibroma.  Integument is otherwise unremarkable.  Vascular: Dorsalis Pedis pulse and Posterior Tibial pulse are 2/4 bilateral. Capillary fill time is immediate to all digits.  Neurological: Grossly intact to light touch with an achilles reflex of +2/5 and a  negative Tinel's sign bilateral.  Musculoskeletal: Tenderness to palpation at plantar fibroma at the distal arch on left greater than right foot.  There is no pain to palpation to the medial calcaneal tubercale and through the insertion of the plantar fascia on the  left/right foot. No pain with compression of calcaneus bilateral. No pain with tuning fork to calcaneus bilateral. No pain with calf compression bilateral. There is decreased Ankle joint and first metatarsophalangeal joint range of motion bilateral. All other joints range of motion within normal limits bilateral. Strength 5/5 in all groups bilateral.   Xray, Right/Left foot:  Normal osseous mineralization. Joint spaces preserved except at first metatarsophalangeal joints where there is arthritis and first ray elevation supportive hallux limitus. No fracture/dislocation/boney destruction. Calcaneal spur present with mild thickening of plantar fascia. No other soft tissue abnormalities or radiopaque foreign bodies.   Assessment and Plan: Problem List Items Addressed This Visit    None    Visit Diagnoses    Left foot pain    -  Primary   Relevant Orders   Ambulatory referral to Physical Therapy   Plantar fibromatosis          -Complete examination performed.  -Xrays reviewed -Discussed with patient in detail the condition of plantar fibroma, how this occurs and general treatment options. Explained both conservative and surgical treatments.  -Patient declined night splint at this time since we did not have her size in stock today -Prescribed physical therapy to assist with helping to reduce fibroma and to help by using iontophoresis and dry needling to break up the fibroma -Prescribe topical verapamil from [place] and dispensed to patient daily stretching exercises. -Recommend patient to ice affected area 1-2x daily. -Encourage good supportive shoes and offloading insoles -Patient to return to office in 4-6 weeks for follow up or sooner if problems or questions arise.  Landis Martins, DPM

## 2018-12-31 ENCOUNTER — Ambulatory Visit (INDEPENDENT_AMBULATORY_CARE_PROVIDER_SITE_OTHER): Payer: BC Managed Care – PPO | Admitting: *Deleted

## 2018-12-31 DIAGNOSIS — J309 Allergic rhinitis, unspecified: Secondary | ICD-10-CM

## 2019-01-07 ENCOUNTER — Ambulatory Visit (INDEPENDENT_AMBULATORY_CARE_PROVIDER_SITE_OTHER): Payer: BC Managed Care – PPO

## 2019-01-07 DIAGNOSIS — M62838 Other muscle spasm: Secondary | ICD-10-CM | POA: Diagnosis not present

## 2019-01-07 DIAGNOSIS — M6289 Other specified disorders of muscle: Secondary | ICD-10-CM | POA: Diagnosis not present

## 2019-01-07 DIAGNOSIS — J309 Allergic rhinitis, unspecified: Secondary | ICD-10-CM

## 2019-01-07 DIAGNOSIS — M6281 Muscle weakness (generalized): Secondary | ICD-10-CM | POA: Diagnosis not present

## 2019-01-07 DIAGNOSIS — R102 Pelvic and perineal pain: Secondary | ICD-10-CM | POA: Diagnosis not present

## 2019-01-12 DIAGNOSIS — M62562 Muscle wasting and atrophy, not elsewhere classified, left lower leg: Secondary | ICD-10-CM | POA: Diagnosis not present

## 2019-01-12 DIAGNOSIS — M79671 Pain in right foot: Secondary | ICD-10-CM | POA: Diagnosis not present

## 2019-01-12 DIAGNOSIS — R269 Unspecified abnormalities of gait and mobility: Secondary | ICD-10-CM | POA: Diagnosis not present

## 2019-01-12 DIAGNOSIS — M79672 Pain in left foot: Secondary | ICD-10-CM | POA: Diagnosis not present

## 2019-01-13 ENCOUNTER — Telehealth: Payer: Self-pay | Admitting: *Deleted

## 2019-01-13 NOTE — Telephone Encounter (Signed)
Yes will do .

## 2019-01-13 NOTE — Telephone Encounter (Signed)
[employer] [facility] Mac requested Dr. Leeanne Rio orders for dry needling. Faxed orders to be signed by Dr. Cannon Kettle.

## 2019-01-14 ENCOUNTER — Ambulatory Visit (INDEPENDENT_AMBULATORY_CARE_PROVIDER_SITE_OTHER): Payer: BC Managed Care – PPO | Admitting: *Deleted

## 2019-01-14 DIAGNOSIS — J309 Allergic rhinitis, unspecified: Secondary | ICD-10-CM | POA: Diagnosis not present

## 2019-01-14 DIAGNOSIS — M79672 Pain in left foot: Secondary | ICD-10-CM | POA: Diagnosis not present

## 2019-01-14 DIAGNOSIS — M79671 Pain in right foot: Secondary | ICD-10-CM | POA: Diagnosis not present

## 2019-01-14 DIAGNOSIS — R269 Unspecified abnormalities of gait and mobility: Secondary | ICD-10-CM | POA: Diagnosis not present

## 2019-01-14 DIAGNOSIS — M62562 Muscle wasting and atrophy, not elsewhere classified, left lower leg: Secondary | ICD-10-CM | POA: Diagnosis not present

## 2019-01-18 ENCOUNTER — Ambulatory Visit (INDEPENDENT_AMBULATORY_CARE_PROVIDER_SITE_OTHER): Payer: BC Managed Care – PPO

## 2019-01-18 DIAGNOSIS — J309 Allergic rhinitis, unspecified: Secondary | ICD-10-CM

## 2019-01-19 DIAGNOSIS — M79671 Pain in right foot: Secondary | ICD-10-CM | POA: Diagnosis not present

## 2019-01-19 DIAGNOSIS — R269 Unspecified abnormalities of gait and mobility: Secondary | ICD-10-CM | POA: Diagnosis not present

## 2019-01-19 DIAGNOSIS — M79672 Pain in left foot: Secondary | ICD-10-CM | POA: Diagnosis not present

## 2019-01-19 DIAGNOSIS — M62562 Muscle wasting and atrophy, not elsewhere classified, left lower leg: Secondary | ICD-10-CM | POA: Diagnosis not present

## 2019-01-22 DIAGNOSIS — R269 Unspecified abnormalities of gait and mobility: Secondary | ICD-10-CM | POA: Diagnosis not present

## 2019-01-22 DIAGNOSIS — M79672 Pain in left foot: Secondary | ICD-10-CM | POA: Diagnosis not present

## 2019-01-22 DIAGNOSIS — M62562 Muscle wasting and atrophy, not elsewhere classified, left lower leg: Secondary | ICD-10-CM | POA: Diagnosis not present

## 2019-01-22 DIAGNOSIS — M79671 Pain in right foot: Secondary | ICD-10-CM | POA: Diagnosis not present

## 2019-01-26 ENCOUNTER — Ambulatory Visit (INDEPENDENT_AMBULATORY_CARE_PROVIDER_SITE_OTHER): Payer: BC Managed Care – PPO | Admitting: *Deleted

## 2019-01-26 DIAGNOSIS — M79671 Pain in right foot: Secondary | ICD-10-CM | POA: Diagnosis not present

## 2019-01-26 DIAGNOSIS — J309 Allergic rhinitis, unspecified: Secondary | ICD-10-CM

## 2019-01-26 DIAGNOSIS — M79672 Pain in left foot: Secondary | ICD-10-CM | POA: Diagnosis not present

## 2019-01-26 DIAGNOSIS — M62562 Muscle wasting and atrophy, not elsewhere classified, left lower leg: Secondary | ICD-10-CM | POA: Diagnosis not present

## 2019-01-26 DIAGNOSIS — R269 Unspecified abnormalities of gait and mobility: Secondary | ICD-10-CM | POA: Diagnosis not present

## 2019-01-29 DIAGNOSIS — M62562 Muscle wasting and atrophy, not elsewhere classified, left lower leg: Secondary | ICD-10-CM | POA: Diagnosis not present

## 2019-01-29 DIAGNOSIS — M79672 Pain in left foot: Secondary | ICD-10-CM | POA: Diagnosis not present

## 2019-01-29 DIAGNOSIS — M79671 Pain in right foot: Secondary | ICD-10-CM | POA: Diagnosis not present

## 2019-01-29 DIAGNOSIS — R269 Unspecified abnormalities of gait and mobility: Secondary | ICD-10-CM | POA: Diagnosis not present

## 2019-02-01 DIAGNOSIS — M62562 Muscle wasting and atrophy, not elsewhere classified, left lower leg: Secondary | ICD-10-CM | POA: Diagnosis not present

## 2019-02-01 DIAGNOSIS — R269 Unspecified abnormalities of gait and mobility: Secondary | ICD-10-CM | POA: Diagnosis not present

## 2019-02-01 DIAGNOSIS — M79671 Pain in right foot: Secondary | ICD-10-CM | POA: Diagnosis not present

## 2019-02-01 DIAGNOSIS — M79672 Pain in left foot: Secondary | ICD-10-CM | POA: Diagnosis not present

## 2019-02-02 DIAGNOSIS — L239 Allergic contact dermatitis, unspecified cause: Secondary | ICD-10-CM | POA: Diagnosis not present

## 2019-02-03 ENCOUNTER — Ambulatory Visit (INDEPENDENT_AMBULATORY_CARE_PROVIDER_SITE_OTHER): Payer: BC Managed Care – PPO | Admitting: *Deleted

## 2019-02-03 DIAGNOSIS — J309 Allergic rhinitis, unspecified: Secondary | ICD-10-CM | POA: Diagnosis not present

## 2019-02-03 DIAGNOSIS — M79671 Pain in right foot: Secondary | ICD-10-CM | POA: Diagnosis not present

## 2019-02-03 DIAGNOSIS — M62562 Muscle wasting and atrophy, not elsewhere classified, left lower leg: Secondary | ICD-10-CM | POA: Diagnosis not present

## 2019-02-03 DIAGNOSIS — R269 Unspecified abnormalities of gait and mobility: Secondary | ICD-10-CM | POA: Diagnosis not present

## 2019-02-03 DIAGNOSIS — M79672 Pain in left foot: Secondary | ICD-10-CM | POA: Diagnosis not present

## 2019-02-09 ENCOUNTER — Ambulatory Visit: Payer: BC Managed Care – PPO | Admitting: Sports Medicine

## 2019-02-09 ENCOUNTER — Ambulatory Visit (INDEPENDENT_AMBULATORY_CARE_PROVIDER_SITE_OTHER): Payer: BC Managed Care – PPO | Admitting: *Deleted

## 2019-02-09 DIAGNOSIS — J309 Allergic rhinitis, unspecified: Secondary | ICD-10-CM

## 2019-02-09 DIAGNOSIS — M79672 Pain in left foot: Secondary | ICD-10-CM | POA: Diagnosis not present

## 2019-02-09 DIAGNOSIS — M62562 Muscle wasting and atrophy, not elsewhere classified, left lower leg: Secondary | ICD-10-CM | POA: Diagnosis not present

## 2019-02-09 DIAGNOSIS — M79671 Pain in right foot: Secondary | ICD-10-CM | POA: Diagnosis not present

## 2019-02-09 DIAGNOSIS — R269 Unspecified abnormalities of gait and mobility: Secondary | ICD-10-CM | POA: Diagnosis not present

## 2019-02-11 DIAGNOSIS — R269 Unspecified abnormalities of gait and mobility: Secondary | ICD-10-CM | POA: Diagnosis not present

## 2019-02-11 DIAGNOSIS — M79671 Pain in right foot: Secondary | ICD-10-CM | POA: Diagnosis not present

## 2019-02-11 DIAGNOSIS — M62562 Muscle wasting and atrophy, not elsewhere classified, left lower leg: Secondary | ICD-10-CM | POA: Diagnosis not present

## 2019-02-11 DIAGNOSIS — M79672 Pain in left foot: Secondary | ICD-10-CM | POA: Diagnosis not present

## 2019-02-16 ENCOUNTER — Ambulatory Visit (INDEPENDENT_AMBULATORY_CARE_PROVIDER_SITE_OTHER): Payer: BC Managed Care – PPO

## 2019-02-16 DIAGNOSIS — M62562 Muscle wasting and atrophy, not elsewhere classified, left lower leg: Secondary | ICD-10-CM | POA: Diagnosis not present

## 2019-02-16 DIAGNOSIS — R269 Unspecified abnormalities of gait and mobility: Secondary | ICD-10-CM | POA: Diagnosis not present

## 2019-02-16 DIAGNOSIS — M79671 Pain in right foot: Secondary | ICD-10-CM | POA: Diagnosis not present

## 2019-02-16 DIAGNOSIS — J309 Allergic rhinitis, unspecified: Secondary | ICD-10-CM | POA: Diagnosis not present

## 2019-02-16 DIAGNOSIS — M79672 Pain in left foot: Secondary | ICD-10-CM | POA: Diagnosis not present

## 2019-02-19 DIAGNOSIS — R269 Unspecified abnormalities of gait and mobility: Secondary | ICD-10-CM | POA: Diagnosis not present

## 2019-02-19 DIAGNOSIS — M79672 Pain in left foot: Secondary | ICD-10-CM | POA: Diagnosis not present

## 2019-02-19 DIAGNOSIS — M79671 Pain in right foot: Secondary | ICD-10-CM | POA: Diagnosis not present

## 2019-02-19 DIAGNOSIS — M62562 Muscle wasting and atrophy, not elsewhere classified, left lower leg: Secondary | ICD-10-CM | POA: Diagnosis not present

## 2019-02-23 ENCOUNTER — Ambulatory Visit: Payer: BC Managed Care – PPO | Admitting: Allergy & Immunology

## 2019-02-23 ENCOUNTER — Encounter: Payer: Self-pay | Admitting: Sports Medicine

## 2019-02-23 ENCOUNTER — Ambulatory Visit: Payer: BC Managed Care – PPO | Admitting: Sports Medicine

## 2019-02-23 ENCOUNTER — Ambulatory Visit: Payer: Self-pay

## 2019-02-23 ENCOUNTER — Encounter: Payer: Self-pay | Admitting: Allergy & Immunology

## 2019-02-23 ENCOUNTER — Other Ambulatory Visit: Payer: Self-pay

## 2019-02-23 VITALS — BP 118/82 | HR 67 | Temp 97.3°F | Resp 16 | Ht 61.0 in | Wt 135.8 lb

## 2019-02-23 DIAGNOSIS — L508 Other urticaria: Secondary | ICD-10-CM | POA: Diagnosis not present

## 2019-02-23 DIAGNOSIS — M722 Plantar fascial fibromatosis: Secondary | ICD-10-CM | POA: Diagnosis not present

## 2019-02-23 DIAGNOSIS — M216X1 Other acquired deformities of right foot: Secondary | ICD-10-CM | POA: Diagnosis not present

## 2019-02-23 DIAGNOSIS — M79671 Pain in right foot: Secondary | ICD-10-CM | POA: Diagnosis not present

## 2019-02-23 DIAGNOSIS — J302 Other seasonal allergic rhinitis: Secondary | ICD-10-CM

## 2019-02-23 DIAGNOSIS — J4530 Mild persistent asthma, uncomplicated: Secondary | ICD-10-CM

## 2019-02-23 DIAGNOSIS — J309 Allergic rhinitis, unspecified: Secondary | ICD-10-CM

## 2019-02-23 DIAGNOSIS — J3089 Other allergic rhinitis: Secondary | ICD-10-CM | POA: Diagnosis not present

## 2019-02-23 DIAGNOSIS — M79672 Pain in left foot: Secondary | ICD-10-CM

## 2019-02-23 DIAGNOSIS — M216X2 Other acquired deformities of left foot: Secondary | ICD-10-CM

## 2019-02-23 MED ORDER — ALBUTEROL SULFATE HFA 108 (90 BASE) MCG/ACT IN AERS: INHALATION_SPRAY | RESPIRATORY_TRACT | 1 refills | Status: DC

## 2019-02-23 MED ORDER — MONTELUKAST SODIUM 10 MG PO TABS: ORAL_TABLET | ORAL | 5 refills | Status: DC

## 2019-02-23 NOTE — Progress Notes (Signed)
Subjective: Ariana Green is a 53 y.o. female patient returns to office for follow up of fibroma pain L>R. Reports that they feel about the same not sure of topical is helping but has started PT and thinks that she is making a lot of improvement and has gained more flexibility and dry needling has helped. No other pedal complaints at this time.   Patient Active Problem List   Diagnosis Date Noted  . Gastroesophageal reflux disease 01/22/2017  . Seasonal and perennial allergic rhinitis 01/22/2017  . Allergic rhinoconjunctivitis 11/19/2014  . Asthma 11/19/2014    Current Outpatient Medications on File Prior to Visit  Medication Sig Dispense Refill  . albuterol (PROAIR HFA) 108 (90 Base) MCG/ACT inhaler INHALE 2 PUFFS BY MOUTH INTO THE LUNGS EVERY 4 HOURS AS NEEDED FOR WHEEZING OR SHORTNESS OF BREATH 8.5 g 1  . beclomethasone (QVAR REDIHALER) 80 MCG/ACT inhaler Inhale 2 puffs into the lungs 2 (two) times daily. (Patient taking differently: Inhale 1 puff into the lungs daily. ) 10.6 g 5  . budesonide (RHINOCORT ALLERGY) 32 MCG/ACT nasal spray Place 1 spray into both nostrils daily as needed for rhinitis.    . celecoxib (CELEBREX) 200 MG capsule TK 1 C PO QD PRF JOINT PAIN    . clobetasol cream (TEMOVATE) 0.05 % APP AA ONE TIME PER DAY    . Dapsone 5 % topical gel Apply 1 application topically daily.    [facility] erythromycin with ethanol (EMGEL) 2 % gel APP 1 APPLICATION TO FACE BID UNTIL CLEAR    . esomeprazole (NEXIUM) 40 MG capsule Take 40 mg by mouth daily.  1  . Estradiol 10 MCG TABS vaginal tablet I 1 T INTRAVAGINALLY 2 TIMES A WK    . Fish Oil OIL Take 1,200 mg by mouth daily.     . lansoprazole (PREVACID) 30 MG capsule Take 30 mg by mouth daily.    . montelukast (SINGULAIR) 10 MG tablet TAKE 1 TABLET(10 MG) BY MOUTH AT BEDTIME 30 tablet 5  . Multiple Vitamin (MULTIVITAMIN) tablet Take 1 tablet by mouth daily.    . mupirocin ointment (BACTROBAN) 2 % Apply 1 application topically 2 (two)  times daily.    . NON FORMULARY [facility] Scar Cream: Verapamil 10% Pentoxifylline 5% 100 gm  Pt to use at Plantar Fibromas Bilateral    . spironolactone (ALDACTONE) 50 MG tablet TK 1 T PO D  2  . triamcinolone (KENALOG) 8.527 % cream 1 application apply on the skin twice a day  1 application apply on the skin twice a day [place] up to 2 weeks on, 1 week off suring flares only    . triamcinolone cream (KENALOG) 0.1 % APPLY A SMALL AMOUNT TOPICALLY TO THE AFFECTED AREA BID    . UNABLE TO FIND TUMRIC 1000MG  DAILY     No current facility-administered medications on file prior to visit.    Allergies  Allergen Reactions  . Erythromycin Base   . Doxycycline Rash    Objective: Physical Exam General: The patient is alert and oriented x3 in no acute distress.  Dermatology: Skin is warm, dry and supple bilateral lower extremities. Nails 1-10 are normal. There is no erythema, edema, no eccymosis, no open lesions present.  Raised mobile soft tissue mass at the plantar distal arch left greater than right measuring 0.3cm on right and 0.8cm on left  Vascular: Dorsalis Pedis pulse and Posterior Tibial pulse are 2/4 bilateral. Capillary fill time is immediate to all digits.  Neurological:  Grossly intact to light touch with an achilles reflex of +2/5 and a negative Tinel's sign bilateral.  Musculoskeletal: Tenderness to palpation at plantar fibroma at the distal arch on left greater than right foot.  Improved supple and less taut plantar fascia bilateral. There is decreased Ankle joint and first metatarsophalangeal joint range of motion bilateral. All other joints range of motion within normal limits bilateral. Strength 5/5 in all groups bilateral.   Assessment and Plan: Problem List Items Addressed This Visit    None    Visit Diagnoses    Plantar fibromatosis    -  Primary   Foot pain, bilateral       Acquired equinus deformity of both feet         -Complete examination performed.   -Re-Discussed with patient in detail the condition of plantar fibroma, how this occurs and general treatment options. Explained both conservative and surgical treatments.  -Continue with PT -Continue with topical verapamil  -Continue with good supportive shoes -Patient to return to office in 8 weeks for follow up or sooner if problems or questions arise.  Asencion Islam, DPM

## 2019-02-23 NOTE — Patient Instructions (Addendum)
1. Mild persistent asthma, uncomplicated - Lung function deferred today.  - We will not make any medication changes at this time.  - Daily controller medication(s): Singulair 10mg  daily - Rescue medications: ProAir 4 puffs every 4-6 hours as needed - Changes during respiratory infections or worsening symptoms: add on Qvar 37mcg to 2 puffs twice daily for TWO WEEKS. - Asthma control goals:  * Full participation in all desired activities (may need albuterol before activity) * Albuterol use two time or less a week on average (not counting use with activity) * Cough interfering with sleep two time or less a month * Oral steroids no more than once a year * No hospitalizations  2. Gastroesophageal reflux disease - Continue with Prevacid 30g once daily.  3. Allergic rhinoconjunctivitis (horse, trees, indoor molds, outdoor molds, cat and dog) - Continue with allergy shots at the same schedule.  - Continue with: Rhincort 1-2 sprays daily, Xyzal (levocetirizine) 5mg  tablet once daily, Singulair (montelukast) 10mg  daily and Astelin (azelastine) 2 sprays per nostril 1-2 times daily as needed - You can use an extra dose of the antihistamine, if needed, for breakthrough symptoms.  - Consider nasal saline rinses 1-2 times daily to remove allergens from the nasal cavities as well as help with mucous clearance (this is especially helpful to do before the nasal sprays are given)  4. Chronic urticaria - Your history does not have any "red flags" such as fevers, joint pains, or permanent skin changes that would be concerning for a more serious cause of hives.  - We will get some labs to rule out serious causes of hives: complete blood count, tryptase level, chronic urticaria panel, CMP, and inflammatory. - Chronic hives are often times a self limited process and will "burn themselves out" over 6-12 months, although this is not always the case.  - Try changing to free and clear detergent to see if this helps.    - Take pictures of the rash when it gets particularly bad. - In the meantime, start suppressive dosing of antihistamines:   - Morning: Xyzal (levocetirizine) 5mg  (one tablet)  - Evening: Xyzal (levocetirizine) 5mg  (one tablet) + Pepcid (famotidine) 30mg  - If you are not tolerating the medications or are tired of taking them every day, we can start treatment with a monthly injectable medication called Xolair.   5. Return in about 6 months (around 08/24/2019). This can be an in-person, a virtual Webex or a telephone follow up visit.   Please inform us of any Emergency Department visits, hospitalizations, or changes in symptoms. Call us before going to the ED for breathing or allergy symptoms since we might be able to fit you in for a sick visit. Feel free to contact us anytime with any questions, problems, or concerns.  It was a pleasure to see you again today!  Websites that have reliable patient information: 1. American Academy of Asthma, Allergy, and Immunology: www.aaaai.org 2. Food Allergy Research and Education (FARE): foodallergy.org 3. Mothers of Asthmatics: http://www.asthmacommunitynetwork.org 4. American College of Allergy, Asthma, and Immunology: www.acaai.org  "Like" [place] on Facebook and Instagram for our latest updates!      Make sure you are registered to vote! If you have moved or changed any of your contact information, you will need to get this updated before voting!  In some cases, you MAY be able to register to vote online: [email or web address]

## 2019-02-23 NOTE — Progress Notes (Addendum)
FOLLOW UP  Date of Service/Encounter:  02/23/19   Assessment:   Mild intermittent asthma without complication  Perennial allergic rhinitis(cats, dogs, indoor and outdoor molds)  Rash - urticaria versus contact dermatitis  Plan/Recommendations:    1. Mild persistent asthma, uncomplicated - Lung function deferred today.  - We will not make any medication changes at this time.  - Daily controller medication(s): Singulair 10mg  daily - Rescue medications: ProAir 4 puffs every 4-6 hours as needed - Changes during respiratory infections or worsening symptoms: add on Qvar 80mcg to 2 puffs twice daily for TWO WEEKS. - Asthma control goals:  * Full participation in all desired activities (may need albuterol before activity) * Albuterol use two time or less a week on average (not counting use with activity) * Cough interfering with sleep two time or less a month * Oral steroids no more than once a year * No hospitalizations  2. Gastroesophageal reflux disease - Continue with Prevacid 30g once daily.  3. Allergic rhinoconjunctivitis (horse, trees, indoor molds, outdoor molds, cat and dog) - Continue with allergy shots at the same schedule.  - Continue with: Rhincort 1-2 sprays daily, Xyzal (levocetirizine) 5mg  tablet once daily, Singulair (montelukast) 10mg  daily and Astelin (azelastine) 2 sprays per nostril 1-2 times daily as needed - You can use an extra dose of the antihistamine, if needed, for breakthrough symptoms.  - Consider nasal saline rinses 1-2 times daily to remove allergens from the nasal cavities as well as help with mucous clearance (this is especially helpful to do before the nasal sprays are given)  4. Chronic urticaria - Your history does not have any "red flags" such as fevers, joint pains, or permanent skin changes that would be concerning for a more serious cause of hives.  - We will get some labs to rule out serious causes of hives: complete blood count,  tryptase level, chronic urticaria panel, CMP, and inflammatory. - Chronic hives are often times a self limited process and will "burn themselves out" over 6-12 months, although this is not always the case.  - Try changing to free and clear detergent to see if this helps.  - Take pictures of the rash when it gets particularly bad. - In the meantime, start suppressive dosing of antihistamines:   - Morning: Xyzal (levocetirizine) 5mg  (one tablet)  - Evening: Xyzal (levocetirizine) 5mg  (one tablet) + Pepcid (famotidine) 30mg  - If you are not tolerating the medications or are tired of taking them every day, we can start treatment with a monthly injectable medication called Xolair.   5. Return in about 6 months (around 08/24/2019). This can be an in-person, a virtual Webex or a telephone follow up visit.  Subjective:   Ariana Green is a 53 y.o. female presenting today for follow up of  Chief Complaint  Patient presents with  . Asthma    doing ok  . Rash    on her neck    Ariana Green has a history of the following: Patient Active Problem List   Diagnosis Date Noted  . Gastroesophageal reflux disease 01/22/2017  . Seasonal and perennial allergic rhinitis 01/22/2017  . Allergic rhinoconjunctivitis 11/19/2014  . Asthma 11/19/2014    History obtained from: chart review and patient.  Ariana Green is a 53 y.o. female presenting for a follow up visit.  She was last seen in June 2020.  At that time, her lung function was deferred.  We continued Singulair 10 mg daily.  She has Qvar that  she adds on during respiratory flares.  For her allergic rhinitis, she was doing well.  She had stopped her allergy shots for a period of 6 to 8 weeks during the initial onset of the coronavirus pandemic.  We continued her nasal sprays as well as Xyzal and Singulair.  Since last visit, she has mostly done well. But she does want to discuss a rash today which has been an issue for several weeks now.   She was  placed on one month of doxycycline, which makes her feel bad. This was used to treat a skin infection. This was a billed as a "stress response". This was all done by Ginger Organ at [facility]. Her last dose of this was the end of August. She then developed this rash in September. She tried using a topical antifungal, but it did not work at all.   She was placed on a long course of prednisone, but it only helped it and did not alleviate it completely. She has stopped the prednisone around one week ago. She was on prednisone for 9 days in total. She takes the Xyzal in the morning. She did testing for RA around ten years ago and her markers were all within normal limits. She does have Celebrex daily as needed, but she takes tumeric daily (for months) and glucosamine was started after the rash started. She has been doing elimination. She does use coconut oil. She has stopped wearing any jewelery and she is still using the same laundry detergent (this is NOT a free and clear detergent).   Gold bond seems to work well. She has never had this rash previously. It does respond within hours to Benadryl. We looked at pictures and it seemed to be most consistent with urticaria.  She does have a history of osteoarthritis, which is a diagnosis she has noted for years.  In fact, she does have some deformities of her fingers.  She apparently had a rheumatology work-up around 10 years ago which was completely negative, per the patient.  She has never been on any kind of DMARD.  Allergy shots are going well. Asthma is controlled with montelukast daily. Arlet's asthma has been well controlled. She has not required rescue medication, experienced nocturnal awakenings due to lower respiratory symptoms, nor have activities of daily living been limited. She has required no Emergency Department or Urgent Care visits for her asthma. She has required zero courses of systemic steroids for asthma exacerbations since the last  visit. ACT score today is 25, indicating excellent asthma symptom control.   Otherwise, there have been no changes to her past medical history, surgical history, family history, or social history.    Review of Systems  Constitutional: Negative.  Negative for chills, fever, malaise/fatigue and weight loss.  HENT: Negative.  Negative for congestion, ear discharge and ear pain.   Eyes: Negative for pain, discharge and redness.  Respiratory: Negative for cough, sputum production, shortness of breath and wheezing.   Cardiovascular: Negative.  Negative for chest pain and palpitations.  Gastrointestinal: Negative for abdominal pain, constipation, diarrhea, heartburn, nausea and vomiting.  Skin: Positive for itching and rash.  Neurological: Negative for dizziness and headaches.  Endo/Heme/Allergies: Negative for environmental allergies. Does not bruise/bleed easily.       Objective:   Blood pressure 118/82, pulse 67, temperature (!) 97.3 F (36.3 C), temperature source Temporal, resp. rate 16, height 5\' 1"  (1.549 m), weight 135 lb 12.8 oz (61.6 kg), SpO2 97 %. Body mass index  is 25.66 kg/m.   Physical Exam:  Physical Exam  Constitutional: She appears well-developed.  HENT:  Head: Normocephalic and atraumatic.  Right Ear: Tympanic membrane, external ear and ear canal normal.  Left Ear: Tympanic membrane, external ear and ear canal normal.  Nose: Mucosal edema and rhinorrhea present. No nasal deformity or septal deviation. No epistaxis. Right sinus exhibits no maxillary sinus tenderness and no frontal sinus tenderness. Left sinus exhibits no maxillary sinus tenderness and no frontal sinus tenderness.  Mouth/Throat: Uvula is midline and oropharynx is clear and moist. Mucous membranes are not pale and not dry.  Eyes: Pupils are equal, round, and reactive to light. Conjunctivae and EOM are normal. Right eye exhibits no chemosis and no discharge. Left eye exhibits no chemosis and no  discharge. Right conjunctiva is not injected. Left conjunctiva is not injected.  Cardiovascular: Normal rate, regular rhythm and normal heart sounds.  Respiratory: Effort normal and breath sounds normal. No accessory muscle usage. No tachypnea. No respiratory distress. She has no wheezes. She has no rhonchi. She has no rales. She exhibits no tenderness.  Moving air well in all lung fields. No increased work of breathing noted.   Lymphadenopathy:    She has no cervical adenopathy.  Neurological: She is alert.  Skin: No abrasion, no petechiae and no rash noted. Rash is not papular, not vesicular and not urticarial. No erythema. No pallor.  There is some erythema around her neck and lower face under her mouth and her chin.  This area is not particularly raised.  It is slightly sebaceous and glistening.  There are some excoriation marks present.  Psychiatric: She has a normal mood and affect.     Diagnostic studies: none     Salvatore Marvel, MD  [facility]

## 2019-02-24 DIAGNOSIS — M79672 Pain in left foot: Secondary | ICD-10-CM | POA: Diagnosis not present

## 2019-02-24 DIAGNOSIS — M62562 Muscle wasting and atrophy, not elsewhere classified, left lower leg: Secondary | ICD-10-CM | POA: Diagnosis not present

## 2019-02-24 DIAGNOSIS — M79671 Pain in right foot: Secondary | ICD-10-CM | POA: Diagnosis not present

## 2019-02-24 DIAGNOSIS — R269 Unspecified abnormalities of gait and mobility: Secondary | ICD-10-CM | POA: Diagnosis not present

## 2019-02-26 DIAGNOSIS — R269 Unspecified abnormalities of gait and mobility: Secondary | ICD-10-CM | POA: Diagnosis not present

## 2019-02-26 DIAGNOSIS — M79671 Pain in right foot: Secondary | ICD-10-CM | POA: Diagnosis not present

## 2019-02-26 DIAGNOSIS — M62562 Muscle wasting and atrophy, not elsewhere classified, left lower leg: Secondary | ICD-10-CM | POA: Diagnosis not present

## 2019-02-26 DIAGNOSIS — M79672 Pain in left foot: Secondary | ICD-10-CM | POA: Diagnosis not present

## 2019-03-01 ENCOUNTER — Other Ambulatory Visit: Payer: Self-pay | Admitting: Allergy & Immunology

## 2019-03-02 ENCOUNTER — Ambulatory Visit (INDEPENDENT_AMBULATORY_CARE_PROVIDER_SITE_OTHER): Payer: BC Managed Care – PPO

## 2019-03-02 DIAGNOSIS — M79671 Pain in right foot: Secondary | ICD-10-CM | POA: Diagnosis not present

## 2019-03-02 DIAGNOSIS — R269 Unspecified abnormalities of gait and mobility: Secondary | ICD-10-CM | POA: Diagnosis not present

## 2019-03-02 DIAGNOSIS — M62562 Muscle wasting and atrophy, not elsewhere classified, left lower leg: Secondary | ICD-10-CM | POA: Diagnosis not present

## 2019-03-02 DIAGNOSIS — M79672 Pain in left foot: Secondary | ICD-10-CM | POA: Diagnosis not present

## 2019-03-02 DIAGNOSIS — J309 Allergic rhinitis, unspecified: Secondary | ICD-10-CM

## 2019-03-03 ENCOUNTER — Encounter: Payer: Self-pay | Admitting: Allergy & Immunology

## 2019-03-03 DIAGNOSIS — R269 Unspecified abnormalities of gait and mobility: Secondary | ICD-10-CM | POA: Diagnosis not present

## 2019-03-03 DIAGNOSIS — M62562 Muscle wasting and atrophy, not elsewhere classified, left lower leg: Secondary | ICD-10-CM | POA: Diagnosis not present

## 2019-03-03 DIAGNOSIS — M79672 Pain in left foot: Secondary | ICD-10-CM | POA: Diagnosis not present

## 2019-03-03 DIAGNOSIS — M79671 Pain in right foot: Secondary | ICD-10-CM | POA: Diagnosis not present

## 2019-03-03 LAB — ALPHA-GAL PANEL
Alpha Gal IgE*: <0.1 kU/L (ref <0.10)
Beef (Bos spp) IgE: <0.1 kU/L (ref <0.35)
Class Interpretation: 0
Class Interpretation: 0
Class Interpretation: 0
Lamb/Mutton (Ovis spp) IgE: <0.1 kU/L (ref <0.35)
Pork (Sus spp) IgE: <0.1 kU/L (ref <0.35)

## 2019-03-03 LAB — C-REACTIVE PROTEIN: CRP: 0 mg/L (ref 0–10)

## 2019-03-03 LAB — THYROID ANTIBODIES
Thyroglobulin Antibody: <1 IU/mL (ref 0.0–0.9)
Thyroperoxidase Ab SerPl-aCnc: <9 IU/mL (ref 0–34)

## 2019-03-03 LAB — TRYPTASE: Tryptase: 6.3 ug/L (ref 2.2–13.2)

## 2019-03-03 LAB — CHRONIC URTICARIA: cu index: <1 (ref <10)

## 2019-03-03 LAB — ANA W/REFLEX IF POSITIVE: Anti Nuclear Antibody (ANA): NEGATIVE

## 2019-03-09 DIAGNOSIS — R269 Unspecified abnormalities of gait and mobility: Secondary | ICD-10-CM | POA: Diagnosis not present

## 2019-03-09 DIAGNOSIS — M79672 Pain in left foot: Secondary | ICD-10-CM | POA: Diagnosis not present

## 2019-03-09 DIAGNOSIS — M79671 Pain in right foot: Secondary | ICD-10-CM | POA: Diagnosis not present

## 2019-03-09 DIAGNOSIS — M62562 Muscle wasting and atrophy, not elsewhere classified, left lower leg: Secondary | ICD-10-CM | POA: Diagnosis not present

## 2019-03-10 ENCOUNTER — Ambulatory Visit (INDEPENDENT_AMBULATORY_CARE_PROVIDER_SITE_OTHER): Payer: BC Managed Care – PPO | Admitting: *Deleted

## 2019-03-10 DIAGNOSIS — J309 Allergic rhinitis, unspecified: Secondary | ICD-10-CM

## 2019-03-11 DIAGNOSIS — M62562 Muscle wasting and atrophy, not elsewhere classified, left lower leg: Secondary | ICD-10-CM | POA: Diagnosis not present

## 2019-03-11 DIAGNOSIS — M79671 Pain in right foot: Secondary | ICD-10-CM | POA: Diagnosis not present

## 2019-03-11 DIAGNOSIS — M79672 Pain in left foot: Secondary | ICD-10-CM | POA: Diagnosis not present

## 2019-03-11 DIAGNOSIS — R269 Unspecified abnormalities of gait and mobility: Secondary | ICD-10-CM | POA: Diagnosis not present

## 2019-03-15 DIAGNOSIS — M79671 Pain in right foot: Secondary | ICD-10-CM | POA: Diagnosis not present

## 2019-03-15 DIAGNOSIS — M79672 Pain in left foot: Secondary | ICD-10-CM | POA: Diagnosis not present

## 2019-03-15 DIAGNOSIS — R269 Unspecified abnormalities of gait and mobility: Secondary | ICD-10-CM | POA: Diagnosis not present

## 2019-03-15 DIAGNOSIS — M62562 Muscle wasting and atrophy, not elsewhere classified, left lower leg: Secondary | ICD-10-CM | POA: Diagnosis not present

## 2019-03-15 NOTE — Progress Notes (Signed)
VIALS EXP 03-14-20 

## 2019-03-16 ENCOUNTER — Ambulatory Visit (INDEPENDENT_AMBULATORY_CARE_PROVIDER_SITE_OTHER): Payer: BC Managed Care – PPO

## 2019-03-16 DIAGNOSIS — J309 Allergic rhinitis, unspecified: Secondary | ICD-10-CM | POA: Diagnosis not present

## 2019-03-17 DIAGNOSIS — J301 Allergic rhinitis due to pollen: Secondary | ICD-10-CM | POA: Diagnosis not present

## 2019-03-18 DIAGNOSIS — L239 Allergic contact dermatitis, unspecified cause: Secondary | ICD-10-CM | POA: Diagnosis not present

## 2019-03-29 ENCOUNTER — Encounter: Payer: Self-pay | Admitting: Allergy & Immunology

## 2019-03-30 ENCOUNTER — Ambulatory Visit (INDEPENDENT_AMBULATORY_CARE_PROVIDER_SITE_OTHER): Payer: BC Managed Care – PPO

## 2019-03-30 DIAGNOSIS — J309 Allergic rhinitis, unspecified: Secondary | ICD-10-CM

## 2019-03-30 DIAGNOSIS — L308 Other specified dermatitis: Secondary | ICD-10-CM | POA: Diagnosis not present

## 2019-03-30 DIAGNOSIS — D485 Neoplasm of uncertain behavior of skin: Secondary | ICD-10-CM | POA: Diagnosis not present

## 2019-03-30 DIAGNOSIS — L989 Disorder of the skin and subcutaneous tissue, unspecified: Secondary | ICD-10-CM | POA: Diagnosis not present

## 2019-04-05 ENCOUNTER — Ambulatory Visit (INDEPENDENT_AMBULATORY_CARE_PROVIDER_SITE_OTHER): Payer: BC Managed Care – PPO

## 2019-04-05 ENCOUNTER — Ambulatory Visit: Payer: BC Managed Care – PPO | Attending: Internal Medicine

## 2019-04-05 DIAGNOSIS — Z20822 Contact with and (suspected) exposure to covid-19: Secondary | ICD-10-CM

## 2019-04-05 DIAGNOSIS — J309 Allergic rhinitis, unspecified: Secondary | ICD-10-CM | POA: Diagnosis not present

## 2019-04-06 LAB — NOVEL CORONAVIRUS, NAA: SARS-CoV-2, NAA: NOT DETECTED

## 2019-04-07 DIAGNOSIS — M62562 Muscle wasting and atrophy, not elsewhere classified, left lower leg: Secondary | ICD-10-CM | POA: Diagnosis not present

## 2019-04-07 DIAGNOSIS — R269 Unspecified abnormalities of gait and mobility: Secondary | ICD-10-CM | POA: Diagnosis not present

## 2019-04-07 DIAGNOSIS — M79671 Pain in right foot: Secondary | ICD-10-CM | POA: Diagnosis not present

## 2019-04-07 DIAGNOSIS — M79672 Pain in left foot: Secondary | ICD-10-CM | POA: Diagnosis not present

## 2019-04-09 NOTE — Telephone Encounter (Signed)
We will get the biopsy results back.  The results are rather nonspecific, but evidently are consistent with urticaria.  Her rash did not look like classic urticaria, but I am willing to give Xolair a chance if she is interested in trying that.  Please reach out to her and gauge her interest in this.  Malachi Bonds, MD [facility]

## 2019-04-13 ENCOUNTER — Ambulatory Visit (INDEPENDENT_AMBULATORY_CARE_PROVIDER_SITE_OTHER): Payer: BC Managed Care – PPO

## 2019-04-13 DIAGNOSIS — M79671 Pain in right foot: Secondary | ICD-10-CM | POA: Diagnosis not present

## 2019-04-13 DIAGNOSIS — R269 Unspecified abnormalities of gait and mobility: Secondary | ICD-10-CM | POA: Diagnosis not present

## 2019-04-13 DIAGNOSIS — J309 Allergic rhinitis, unspecified: Secondary | ICD-10-CM | POA: Diagnosis not present

## 2019-04-13 DIAGNOSIS — M62562 Muscle wasting and atrophy, not elsewhere classified, left lower leg: Secondary | ICD-10-CM | POA: Diagnosis not present

## 2019-04-13 DIAGNOSIS — M79672 Pain in left foot: Secondary | ICD-10-CM | POA: Diagnosis not present

## 2019-04-15 DIAGNOSIS — M79672 Pain in left foot: Secondary | ICD-10-CM | POA: Diagnosis not present

## 2019-04-15 DIAGNOSIS — M79671 Pain in right foot: Secondary | ICD-10-CM | POA: Diagnosis not present

## 2019-04-15 DIAGNOSIS — R269 Unspecified abnormalities of gait and mobility: Secondary | ICD-10-CM | POA: Diagnosis not present

## 2019-04-15 DIAGNOSIS — M62562 Muscle wasting and atrophy, not elsewhere classified, left lower leg: Secondary | ICD-10-CM | POA: Diagnosis not present

## 2019-04-19 DIAGNOSIS — M62562 Muscle wasting and atrophy, not elsewhere classified, left lower leg: Secondary | ICD-10-CM | POA: Diagnosis not present

## 2019-04-19 DIAGNOSIS — M79672 Pain in left foot: Secondary | ICD-10-CM | POA: Diagnosis not present

## 2019-04-19 DIAGNOSIS — M79671 Pain in right foot: Secondary | ICD-10-CM | POA: Diagnosis not present

## 2019-04-19 DIAGNOSIS — R269 Unspecified abnormalities of gait and mobility: Secondary | ICD-10-CM | POA: Diagnosis not present

## 2019-04-22 DIAGNOSIS — M79672 Pain in left foot: Secondary | ICD-10-CM | POA: Diagnosis not present

## 2019-04-22 DIAGNOSIS — M79671 Pain in right foot: Secondary | ICD-10-CM | POA: Diagnosis not present

## 2019-04-22 DIAGNOSIS — M62562 Muscle wasting and atrophy, not elsewhere classified, left lower leg: Secondary | ICD-10-CM | POA: Diagnosis not present

## 2019-04-22 DIAGNOSIS — R269 Unspecified abnormalities of gait and mobility: Secondary | ICD-10-CM | POA: Diagnosis not present

## 2019-04-23 ENCOUNTER — Ambulatory Visit (INDEPENDENT_AMBULATORY_CARE_PROVIDER_SITE_OTHER): Payer: BC Managed Care – PPO | Admitting: *Deleted

## 2019-04-23 DIAGNOSIS — J309 Allergic rhinitis, unspecified: Secondary | ICD-10-CM

## 2019-04-26 ENCOUNTER — Ambulatory Visit (INDEPENDENT_AMBULATORY_CARE_PROVIDER_SITE_OTHER): Payer: BC Managed Care – PPO

## 2019-04-26 DIAGNOSIS — M79671 Pain in right foot: Secondary | ICD-10-CM | POA: Diagnosis not present

## 2019-04-26 DIAGNOSIS — J309 Allergic rhinitis, unspecified: Secondary | ICD-10-CM | POA: Diagnosis not present

## 2019-04-26 DIAGNOSIS — R269 Unspecified abnormalities of gait and mobility: Secondary | ICD-10-CM | POA: Diagnosis not present

## 2019-04-26 DIAGNOSIS — M62562 Muscle wasting and atrophy, not elsewhere classified, left lower leg: Secondary | ICD-10-CM | POA: Diagnosis not present

## 2019-04-26 DIAGNOSIS — M79672 Pain in left foot: Secondary | ICD-10-CM | POA: Diagnosis not present

## 2019-04-27 ENCOUNTER — Encounter: Payer: Self-pay | Admitting: Sports Medicine

## 2019-04-27 ENCOUNTER — Other Ambulatory Visit: Payer: Self-pay

## 2019-04-27 ENCOUNTER — Ambulatory Visit: Payer: BC Managed Care – PPO | Admitting: Sports Medicine

## 2019-04-27 DIAGNOSIS — M722 Plantar fascial fibromatosis: Secondary | ICD-10-CM | POA: Diagnosis not present

## 2019-04-27 DIAGNOSIS — M216X1 Other acquired deformities of right foot: Secondary | ICD-10-CM

## 2019-04-27 DIAGNOSIS — M79672 Pain in left foot: Secondary | ICD-10-CM | POA: Diagnosis not present

## 2019-04-27 DIAGNOSIS — M216X2 Other acquired deformities of left foot: Secondary | ICD-10-CM

## 2019-04-27 DIAGNOSIS — M79671 Pain in right foot: Secondary | ICD-10-CM

## 2019-04-27 NOTE — Progress Notes (Signed)
Subjective: Ariana Green is a 54 y.o. female patient returns to office for follow up of fibroma pain L>R. Reports that she is doing much better. They no longer bother her and reports that PT is really helping. No other pedal complaints at this time.   Patient Active Problem List   Diagnosis Date Noted  . Gastroesophageal reflux disease 01/22/2017  . Seasonal and perennial allergic rhinitis 01/22/2017  . Allergic rhinoconjunctivitis 11/19/2014  . Asthma 11/19/2014    Current Outpatient Medications on File Prior to Visit  Medication Sig Dispense Refill  . albuterol (PROAIR HFA) 108 (90 Base) MCG/ACT inhaler INHALE 2 PUFFS BY MOUTH INTO THE LUNGS EVERY 4 HOURS AS NEEDED FOR WHEEZING OR SHORTNESS OF BREATH 8.5 g 1  . beclomethasone (QVAR REDIHALER) 80 MCG/ACT inhaler Inhale 2 puffs into the lungs 2 (two) times daily. (Patient taking differently: Inhale 1 puff into the lungs daily. ) 10.6 g 5  . betamethasone dipropionate 0.05 % cream Apply 1 a small amount to affected area twice a day  2 wks on, off repeat prn    . budesonide (RHINOCORT ALLERGY) 32 MCG/ACT nasal spray Place 1 spray into both nostrils daily as needed for rhinitis.    . celecoxib (CELEBREX) 200 MG capsule TK 1 C PO QD PRF JOINT PAIN    . clobetasol cream (TEMOVATE) 0.05 % APP AA ONE TIME PER DAY    . Dapsone 5 % topical gel Apply 1 application topically daily.    Marland Kitchen erythromycin with ethanol (EMGEL) 2 % gel APP 1 APPLICATION TO FACE BID UNTIL CLEAR    . esomeprazole (NEXIUM) 40 MG capsule Take 40 mg by mouth daily.  1  . Estradiol 10 MCG TABS vaginal tablet I 1 T INTRAVAGINALLY 2 TIMES A WK    . Fish Oil OIL Take 1,200 mg by mouth daily.     . lansoprazole (PREVACID) 30 MG capsule Take 30 mg by mouth daily.    . montelukast (SINGULAIR) 10 MG tablet TAKE 1 TABLET(10 MG) BY MOUTH AT BEDTIME 30 tablet 5  . Multiple Vitamin (MULTIVITAMIN) tablet Take 1 tablet by mouth daily.    . mupirocin ointment (BACTROBAN) 2 % Apply 1  application topically 2 (two) times daily.    . NON FORMULARY [facility] Scar Cream: Verapamil 10% Pentoxifylline 5% 100 gm  Pt to use at Plantar Fibromas Bilateral    . spironolactone (ALDACTONE) 50 MG tablet TK 1 T PO D  2  . triamcinolone (KENALOG) 0.025 % cream 1 application apply on the skin twice a day  1 application apply on the skin twice a day [place] up to 2 weeks on, 1 week off suring flares only    . triamcinolone cream (KENALOG) 0.1 % APPLY A SMALL AMOUNT TOPICALLY TO THE AFFECTED AREA BID    . UNABLE TO FIND TUMRIC 1000MG  DAILY     No current facility-administered medications on file prior to visit.    Allergies  Allergen Reactions  . Erythromycin Base   . Doxycycline Rash    Objective: Physical Exam General: The patient is alert and oriented x3 in no acute distress.  Dermatology: Skin is warm, dry and supple bilateral lower extremities. Nails 1-10 are normal. There is no erythema, edema, no eccymosis, no open lesions present.  Raised mobile soft tissue mass at the plantar distal arch left greater than right measuring 0.3cm on left and no clearly identifiable mass on right appears to be more soft and supple and dissolved.  Vascular: Dorsalis Pedis pulse and Posterior Tibial pulse are 2/4 bilateral. Capillary fill time is immediate to all digits.  Neurological: Grossly intact to light touch with an achilles reflex of +2/5 and a negative Tinel's sign bilateral.  Musculoskeletal: Tenderness to palpation at plantar fibroma at the distal arch on left greater than right foot.  Improved supple and less taut plantar fascia bilateral. There is decreased Ankle joint and first metatarsophalangeal joint range of motion bilateral. All other joints range of motion within normal limits bilateral. Strength 5/5 in all groups bilateral.   Assessment and Plan: Problem List Items Addressed This Visit    None    Visit Diagnoses    Plantar fibromatosis    -  Primary   Foot pain,  bilateral       Acquired equinus deformity of both feet         -Complete examination performed.  -Re-Discussed with patient in detail the condition of plantar fibroma, how this occurs and general treatment options.  -Continue with PT until completed  -Continue with topical verapamil; refill provided  -Continue with good supportive shoes -Patient to return to office as needed for follow up or sooner if problems or questions arise.  Landis Martins, DPM

## 2019-04-28 DIAGNOSIS — M62562 Muscle wasting and atrophy, not elsewhere classified, left lower leg: Secondary | ICD-10-CM | POA: Diagnosis not present

## 2019-04-28 DIAGNOSIS — M79671 Pain in right foot: Secondary | ICD-10-CM | POA: Diagnosis not present

## 2019-04-28 DIAGNOSIS — M79672 Pain in left foot: Secondary | ICD-10-CM | POA: Diagnosis not present

## 2019-04-28 DIAGNOSIS — R269 Unspecified abnormalities of gait and mobility: Secondary | ICD-10-CM | POA: Diagnosis not present

## 2019-05-03 ENCOUNTER — Ambulatory Visit (INDEPENDENT_AMBULATORY_CARE_PROVIDER_SITE_OTHER): Payer: BC Managed Care – PPO

## 2019-05-03 DIAGNOSIS — M62562 Muscle wasting and atrophy, not elsewhere classified, left lower leg: Secondary | ICD-10-CM | POA: Diagnosis not present

## 2019-05-03 DIAGNOSIS — J309 Allergic rhinitis, unspecified: Secondary | ICD-10-CM

## 2019-05-03 DIAGNOSIS — R269 Unspecified abnormalities of gait and mobility: Secondary | ICD-10-CM | POA: Diagnosis not present

## 2019-05-03 DIAGNOSIS — M79672 Pain in left foot: Secondary | ICD-10-CM | POA: Diagnosis not present

## 2019-05-03 DIAGNOSIS — M79671 Pain in right foot: Secondary | ICD-10-CM | POA: Diagnosis not present

## 2019-05-05 DIAGNOSIS — M79672 Pain in left foot: Secondary | ICD-10-CM | POA: Diagnosis not present

## 2019-05-05 DIAGNOSIS — R269 Unspecified abnormalities of gait and mobility: Secondary | ICD-10-CM | POA: Diagnosis not present

## 2019-05-05 DIAGNOSIS — M62562 Muscle wasting and atrophy, not elsewhere classified, left lower leg: Secondary | ICD-10-CM | POA: Diagnosis not present

## 2019-05-05 DIAGNOSIS — M79671 Pain in right foot: Secondary | ICD-10-CM | POA: Diagnosis not present

## 2019-05-17 DIAGNOSIS — M79671 Pain in right foot: Secondary | ICD-10-CM | POA: Diagnosis not present

## 2019-05-17 DIAGNOSIS — M62562 Muscle wasting and atrophy, not elsewhere classified, left lower leg: Secondary | ICD-10-CM | POA: Diagnosis not present

## 2019-05-17 DIAGNOSIS — R269 Unspecified abnormalities of gait and mobility: Secondary | ICD-10-CM | POA: Diagnosis not present

## 2019-05-17 DIAGNOSIS — M79672 Pain in left foot: Secondary | ICD-10-CM | POA: Diagnosis not present

## 2019-05-18 DIAGNOSIS — M79672 Pain in left foot: Secondary | ICD-10-CM | POA: Diagnosis not present

## 2019-05-18 DIAGNOSIS — L501 Idiopathic urticaria: Secondary | ICD-10-CM | POA: Diagnosis not present

## 2019-05-18 DIAGNOSIS — M79671 Pain in right foot: Secondary | ICD-10-CM | POA: Diagnosis not present

## 2019-05-18 DIAGNOSIS — M62562 Muscle wasting and atrophy, not elsewhere classified, left lower leg: Secondary | ICD-10-CM | POA: Diagnosis not present

## 2019-05-18 DIAGNOSIS — R269 Unspecified abnormalities of gait and mobility: Secondary | ICD-10-CM | POA: Diagnosis not present

## 2019-05-20 DIAGNOSIS — Z1231 Encounter for screening mammogram for malignant neoplasm of breast: Secondary | ICD-10-CM | POA: Diagnosis not present

## 2019-05-24 ENCOUNTER — Ambulatory Visit (INDEPENDENT_AMBULATORY_CARE_PROVIDER_SITE_OTHER): Payer: BC Managed Care – PPO

## 2019-05-24 DIAGNOSIS — R269 Unspecified abnormalities of gait and mobility: Secondary | ICD-10-CM | POA: Diagnosis not present

## 2019-05-24 DIAGNOSIS — J309 Allergic rhinitis, unspecified: Secondary | ICD-10-CM

## 2019-05-24 DIAGNOSIS — M79672 Pain in left foot: Secondary | ICD-10-CM | POA: Diagnosis not present

## 2019-05-24 DIAGNOSIS — M62562 Muscle wasting and atrophy, not elsewhere classified, left lower leg: Secondary | ICD-10-CM | POA: Diagnosis not present

## 2019-05-24 DIAGNOSIS — M79671 Pain in right foot: Secondary | ICD-10-CM | POA: Diagnosis not present

## 2019-05-26 DIAGNOSIS — M79671 Pain in right foot: Secondary | ICD-10-CM | POA: Diagnosis not present

## 2019-05-26 DIAGNOSIS — M79672 Pain in left foot: Secondary | ICD-10-CM | POA: Diagnosis not present

## 2019-05-26 DIAGNOSIS — R269 Unspecified abnormalities of gait and mobility: Secondary | ICD-10-CM | POA: Diagnosis not present

## 2019-05-26 DIAGNOSIS — M62562 Muscle wasting and atrophy, not elsewhere classified, left lower leg: Secondary | ICD-10-CM | POA: Diagnosis not present

## 2019-05-28 ENCOUNTER — Other Ambulatory Visit: Payer: Self-pay

## 2019-05-28 ENCOUNTER — Ambulatory Visit (INDEPENDENT_AMBULATORY_CARE_PROVIDER_SITE_OTHER): Payer: BC Managed Care – PPO

## 2019-05-28 ENCOUNTER — Telehealth: Payer: Self-pay

## 2019-05-28 DIAGNOSIS — L501 Idiopathic urticaria: Secondary | ICD-10-CM

## 2019-05-28 DIAGNOSIS — L508 Other urticaria: Secondary | ICD-10-CM

## 2019-05-28 MED ORDER — OMALIZUMAB 150 MG ~~LOC~~ SOLR
300.0000 mg | SUBCUTANEOUS | Status: DC
  Administered 2019-05-28 – 2020-02-08 (×10): 300 mg via SUBCUTANEOUS

## 2019-05-28 MED ORDER — EPINEPHRINE 0.3 MG/0.3ML IJ SOAJ: 0.3000 mg | INTRAMUSCULAR | 1 refills | Status: DC | PRN

## 2019-05-28 NOTE — Telephone Encounter (Signed)
Patient informed of medication instructions. Patient doing well after her first Xolair injection today.

## 2019-05-28 NOTE — Telephone Encounter (Signed)
Lets continue that current antihistamine regimen until her next dose of Xolair.  At that time, we can change to cetirizine 10 mg twice daily in combination with Allegra daily.  Hopefully we can continue to wean from there.  Malachi Bonds, MD [facility]

## 2019-05-28 NOTE — Telephone Encounter (Signed)
Patient wants to make sure that in addition to the Xolair should she continue her antihistamine regimen of 4 Zyrtec & 1 Allegra daily. I did let her know that she should continue all medications as prescribed unless the doctor advises otherwise. Patient would like your input please and thank you.

## 2019-05-28 NOTE — Progress Notes (Signed)
Immunotherapy   Patient Details  Name: Ariana Green MRN: [id number] Date of Birth: 12/08/1965  05/28/2019  Ariana Green started injections for  Xolair 300 mg for urticaria every 4 weeks.  Epi-Pen:Epi-Pen Available  Consent signed and patient instructions given. Patient waited 60 minutes in office per Dr. Ellouise Newer preference.  Patient reported "floaty" feeling at the end of one hour, which she initially had at the begginning of her time. She said that she had the same feeling when she got the COVID vaccine as well. I let Anne, FNP know and per her instructions checked vitals before patient left. BP: 104/70 Oxy Sat: 97 HR: 60 RR: 16 Patient had no local reactions on her arms and no signs of anaphylaxis upon departure.   Thurston Hole, FNP also spoke with and assessed patient.   Exie Parody 05/28/2019, 9:43 AM

## 2019-05-31 DIAGNOSIS — M79672 Pain in left foot: Secondary | ICD-10-CM | POA: Diagnosis not present

## 2019-05-31 DIAGNOSIS — M79671 Pain in right foot: Secondary | ICD-10-CM | POA: Diagnosis not present

## 2019-05-31 DIAGNOSIS — R269 Unspecified abnormalities of gait and mobility: Secondary | ICD-10-CM | POA: Diagnosis not present

## 2019-05-31 DIAGNOSIS — M62562 Muscle wasting and atrophy, not elsewhere classified, left lower leg: Secondary | ICD-10-CM | POA: Diagnosis not present

## 2019-06-02 ENCOUNTER — Ambulatory Visit (INDEPENDENT_AMBULATORY_CARE_PROVIDER_SITE_OTHER): Payer: BC Managed Care – PPO

## 2019-06-02 DIAGNOSIS — R269 Unspecified abnormalities of gait and mobility: Secondary | ICD-10-CM | POA: Diagnosis not present

## 2019-06-02 DIAGNOSIS — J309 Allergic rhinitis, unspecified: Secondary | ICD-10-CM | POA: Diagnosis not present

## 2019-06-02 DIAGNOSIS — M79672 Pain in left foot: Secondary | ICD-10-CM | POA: Diagnosis not present

## 2019-06-02 DIAGNOSIS — M62562 Muscle wasting and atrophy, not elsewhere classified, left lower leg: Secondary | ICD-10-CM | POA: Diagnosis not present

## 2019-06-02 DIAGNOSIS — M79671 Pain in right foot: Secondary | ICD-10-CM | POA: Diagnosis not present

## 2019-06-07 DIAGNOSIS — R269 Unspecified abnormalities of gait and mobility: Secondary | ICD-10-CM | POA: Diagnosis not present

## 2019-06-07 DIAGNOSIS — M79672 Pain in left foot: Secondary | ICD-10-CM | POA: Diagnosis not present

## 2019-06-07 DIAGNOSIS — M79671 Pain in right foot: Secondary | ICD-10-CM | POA: Diagnosis not present

## 2019-06-07 DIAGNOSIS — M62562 Muscle wasting and atrophy, not elsewhere classified, left lower leg: Secondary | ICD-10-CM | POA: Diagnosis not present

## 2019-06-09 DIAGNOSIS — M79672 Pain in left foot: Secondary | ICD-10-CM | POA: Diagnosis not present

## 2019-06-09 DIAGNOSIS — M62562 Muscle wasting and atrophy, not elsewhere classified, left lower leg: Secondary | ICD-10-CM | POA: Diagnosis not present

## 2019-06-09 DIAGNOSIS — R269 Unspecified abnormalities of gait and mobility: Secondary | ICD-10-CM | POA: Diagnosis not present

## 2019-06-09 DIAGNOSIS — M79671 Pain in right foot: Secondary | ICD-10-CM | POA: Diagnosis not present

## 2019-06-14 ENCOUNTER — Ambulatory Visit (INDEPENDENT_AMBULATORY_CARE_PROVIDER_SITE_OTHER): Payer: BC Managed Care – PPO

## 2019-06-14 DIAGNOSIS — J309 Allergic rhinitis, unspecified: Secondary | ICD-10-CM | POA: Diagnosis not present

## 2019-06-15 DIAGNOSIS — R269 Unspecified abnormalities of gait and mobility: Secondary | ICD-10-CM | POA: Diagnosis not present

## 2019-06-15 DIAGNOSIS — L501 Idiopathic urticaria: Secondary | ICD-10-CM | POA: Diagnosis not present

## 2019-06-15 DIAGNOSIS — M79672 Pain in left foot: Secondary | ICD-10-CM | POA: Diagnosis not present

## 2019-06-15 DIAGNOSIS — M79671 Pain in right foot: Secondary | ICD-10-CM | POA: Diagnosis not present

## 2019-06-15 DIAGNOSIS — M62562 Muscle wasting and atrophy, not elsewhere classified, left lower leg: Secondary | ICD-10-CM | POA: Diagnosis not present

## 2019-06-25 ENCOUNTER — Ambulatory Visit (INDEPENDENT_AMBULATORY_CARE_PROVIDER_SITE_OTHER): Payer: BC Managed Care – PPO

## 2019-06-25 ENCOUNTER — Other Ambulatory Visit: Payer: Self-pay

## 2019-06-25 DIAGNOSIS — L501 Idiopathic urticaria: Secondary | ICD-10-CM | POA: Diagnosis not present

## 2019-06-25 DIAGNOSIS — L508 Other urticaria: Secondary | ICD-10-CM

## 2019-06-29 ENCOUNTER — Ambulatory Visit (INDEPENDENT_AMBULATORY_CARE_PROVIDER_SITE_OTHER): Payer: BC Managed Care – PPO

## 2019-06-29 DIAGNOSIS — J309 Allergic rhinitis, unspecified: Secondary | ICD-10-CM

## 2019-06-30 DIAGNOSIS — M79672 Pain in left foot: Secondary | ICD-10-CM | POA: Diagnosis not present

## 2019-06-30 DIAGNOSIS — M62562 Muscle wasting and atrophy, not elsewhere classified, left lower leg: Secondary | ICD-10-CM | POA: Diagnosis not present

## 2019-06-30 DIAGNOSIS — R269 Unspecified abnormalities of gait and mobility: Secondary | ICD-10-CM | POA: Diagnosis not present

## 2019-06-30 DIAGNOSIS — M79671 Pain in right foot: Secondary | ICD-10-CM | POA: Diagnosis not present

## 2019-07-08 ENCOUNTER — Ambulatory Visit (INDEPENDENT_AMBULATORY_CARE_PROVIDER_SITE_OTHER): Payer: BC Managed Care – PPO

## 2019-07-08 DIAGNOSIS — J309 Allergic rhinitis, unspecified: Secondary | ICD-10-CM | POA: Diagnosis not present

## 2019-07-12 ENCOUNTER — Ambulatory Visit (INDEPENDENT_AMBULATORY_CARE_PROVIDER_SITE_OTHER): Payer: BC Managed Care – PPO

## 2019-07-12 DIAGNOSIS — J309 Allergic rhinitis, unspecified: Secondary | ICD-10-CM | POA: Diagnosis not present

## 2019-07-20 ENCOUNTER — Ambulatory Visit (INDEPENDENT_AMBULATORY_CARE_PROVIDER_SITE_OTHER): Payer: BC Managed Care – PPO

## 2019-07-20 DIAGNOSIS — J309 Allergic rhinitis, unspecified: Secondary | ICD-10-CM

## 2019-07-20 DIAGNOSIS — L501 Idiopathic urticaria: Secondary | ICD-10-CM | POA: Diagnosis not present

## 2019-07-23 ENCOUNTER — Other Ambulatory Visit: Payer: Self-pay

## 2019-07-23 ENCOUNTER — Ambulatory Visit (INDEPENDENT_AMBULATORY_CARE_PROVIDER_SITE_OTHER): Payer: BC Managed Care – PPO

## 2019-07-23 DIAGNOSIS — L508 Other urticaria: Secondary | ICD-10-CM

## 2019-07-23 DIAGNOSIS — L501 Idiopathic urticaria: Secondary | ICD-10-CM | POA: Diagnosis not present

## 2019-07-27 ENCOUNTER — Ambulatory Visit (INDEPENDENT_AMBULATORY_CARE_PROVIDER_SITE_OTHER): Payer: BC Managed Care – PPO

## 2019-07-27 ENCOUNTER — Telehealth: Payer: Self-pay

## 2019-07-27 DIAGNOSIS — J309 Allergic rhinitis, unspecified: Secondary | ICD-10-CM

## 2019-07-27 NOTE — Telephone Encounter (Signed)
Patient came in for her injections and was mentioning she had a continuous rash on her neck that has not improved when beginning on Xolair. Patient was able to speak with Dr. Dellis Anes to decide what options she may take and both discuss on doing patch testing and possible Dupixent.

## 2019-07-27 NOTE — Telephone Encounter (Signed)
I will be sure to address these items in her next office visit.   Malachi Bonds, MD [facility]

## 2019-07-27 NOTE — Telephone Encounter (Signed)
Routing to [place] as well. I am going to try to work on [employer] approval. She is following up with me in the next 1-2 weeks to discuss next steps.   Malachi Bonds, MD [facility]

## 2019-07-27 NOTE — Telephone Encounter (Signed)
With her Ins may be difficult to get urticaria approval for Dupixent but could try for atopic dermatitis if she has tried topical steroid and elidel/protopic therapy. Also will need documentation since last visit in Dec

## 2019-08-02 ENCOUNTER — Ambulatory Visit: Payer: BC Managed Care – PPO | Admitting: Allergy

## 2019-08-02 ENCOUNTER — Encounter: Payer: Self-pay | Admitting: Allergy

## 2019-08-02 ENCOUNTER — Other Ambulatory Visit: Payer: Self-pay

## 2019-08-02 VITALS — BP 120/78 | HR 97 | Temp 97.8°F | Resp 17

## 2019-08-02 DIAGNOSIS — L239 Allergic contact dermatitis, unspecified cause: Secondary | ICD-10-CM | POA: Diagnosis not present

## 2019-08-02 NOTE — Assessment & Plan Note (Signed)
.   TRUE test patches placed today. . Please avoid strenuous physical activities and do not get the patches on the back wet. No showering until final patch reading done. Molli Knock to take antihistamines for itching but avoid placing any creams on the back where the patches are. . We will remove the patches on Wednesday and will do our initial read. . Then you will come back on Friday for a final read.

## 2019-08-02 NOTE — Patient Instructions (Addendum)
Patches placed today. Please avoid strenuous physical activities and do not get the patches on the back wet. No showering until final patch reading done. Okay to take antihistamines for itching but avoid placing any creams on the back where the patches are. We will remove the patches on Wednesday and will do our initial read. Then you will come back on Friday for a final read. 

## 2019-08-02 NOTE — Progress Notes (Signed)
   Follow Up Note  RE: Ariana Green MRN: [id number] DOB: 06-15-1965 Date of Office Visit: 08/02/2019  Referring provider: Marden Noble, MD Primary care provider: Marden Noble, MD  History of Present Illness: I had the pleasure of seeing Ariana Green for a follow up visit at the [facility] on 08/02/2019. She is a 54 y.o. female, who is being followed for asthma, allergic rhinoconjunctivitis, chronic urticaria, dermatitis and GERD. Today she is here for patch test placement, given suspected history of contact dermatitis.   Diagnostics: TRUE Test patches placed.   Assessment and Plan: Ariana Green is a 54 y.o. female with: Allergic contact dermatitis . TRUE test patches placed today. . Please avoid strenuous physical activities and do not get the patches on the back wet. No showering until final patch reading done. Molli Knock to take antihistamines for itching but avoid placing any creams on the back where the patches are. . We will remove the patches on Wednesday and will do our initial read. . Then you will come back on Friday for a final read.  It was my pleasure to see Ariana Green today and participate in her care. Please feel free to contact me with any questions or concerns.  Sincerely,  Wyline Mood, DO Allergy & Immunology  [facility] office: [phone] [facility] office: [phone] [place] office: [phone]

## 2019-08-03 ENCOUNTER — Encounter: Payer: Self-pay | Admitting: Allergy & Immunology

## 2019-08-03 ENCOUNTER — Ambulatory Visit: Payer: BC Managed Care – PPO | Admitting: Allergy & Immunology

## 2019-08-03 DIAGNOSIS — J4530 Mild persistent asthma, uncomplicated: Secondary | ICD-10-CM | POA: Diagnosis not present

## 2019-08-03 DIAGNOSIS — L508 Other urticaria: Secondary | ICD-10-CM | POA: Diagnosis not present

## 2019-08-03 DIAGNOSIS — J3089 Other allergic rhinitis: Secondary | ICD-10-CM | POA: Diagnosis not present

## 2019-08-03 MED ORDER — EUCRISA 2 % EX OINT: 1.0000 "application " | TOPICAL_OINTMENT | Freq: Two times a day (BID) | CUTANEOUS | 2 refills | Status: DC | PRN

## 2019-08-03 NOTE — Patient Instructions (Addendum)
1. Mild persistent asthma, uncomplicated - Lung function looks great today.  - We will not make any medication changes at this time.  - Daily controller medication(s): Singulair 10mg  daily - Rescue medications: ProAir 4 puffs every 4-6 hours as needed - Changes during respiratory infections or worsening symptoms: add on Qvar to 2 puffs twice daily for TWO WEEKS. - Asthma control goals:  * Full participation in all desired activities (may need albuterol before activity) * Albuterol use two time or less a week on average (not counting use with activity) * Cough interfering with sleep two time or less a month * Oral steroids no more than once a year * No hospitalizations  2. Gastroesophageal reflux disease - Continue with Nexium daily.  3. Allergic rhinoconjunctivitis (horse, trees, indoor molds, outdoor molds, cat and dog) - Continue with allergy shots at the same schedule.  - Continue with: Rhincort 1-2 sprays daily, Xyzal (levocetirizine) 5mg  tablet once daily, Singulair (montelukast) 10mg  daily and Astelin (azelastine) 2 sprays per nostril 1-2 times daily as needed - You can use an extra dose of the antihistamine, if needed, for breakthrough symptoms.  - Consider nasal saline rinses 1-2 times daily to remove allergens from the nasal cavities as well as help with mucous clearance (this is especially helpful to do before the nasal sprays are given)  4. Chronic urticaria - We are going to see if we can get Dupixent approved for your asthma (this can also help with hives). - Previous workup was negative.  - In the meantime, continue with your current dosing of antihistamines:     - Morning: Zyrtec two tablets in the morning    - Evening:  Zyrtec two tablets in the morning - Add on Eucrisa twice daily as needed (not a steroid so there are no side effects like the betamethasone).  5. Return in about 6 months (around 02/03/2020). This can be an in-person, a virtual Webex or a telephone  follow up visit.   Please inform of any Emergency Department visits, hospitalizations, or changes in symptoms. Call before going to the ED for breathing or allergy symptoms since we might be able to fit you in for a sick visit. Feel free to contact 02/05/2020 anytime with any questions, problems, or concerns.  It was a pleasure to see you again today!  Websites that have reliable patient information: 1. American Academy of Asthma, Allergy, and Immunology: www.aaaai.org 2. Food Allergy Research and Education (FARE): foodallergy.org 3. Mothers of Asthmatics: http://www.asthmacommunitynetwork.org 4. American College of Allergy, Asthma, and Immunology: www.acaai.org   COVID-19 Vaccine Information can be found at: [place] For questions related to vaccine distribution or appointments, please email vaccine@ .com or call [phone].     "Like" [place] on Facebook and Instagram for our latest updates!       HAPPY SPRING!  Make sure you are registered to vote! If you have moved or changed any of your contact information, you will need to get this updated before voting!  In some cases, you MAY be able to register to vote online: [email or web address]

## 2019-08-03 NOTE — Progress Notes (Signed)
   Follow Up Note  RE: Ariana Green MRN: [id number] DOB: [birth year] Date of Office Visit: 08/04/2019  Referring provider: Marden Noble, MD Primary care provider: Marden Noble, MD  History of Present Illness: I had the pleasure of seeing Ariana Green for a follow up visit at the [facility] on 08/04/2019. She is a 54 y.o. female, who is being followed for asthma, allergic rhinoconjunctivitis, chronic urticaria, dermatitis and GERD. Today she is here for initial patch test interpretation, given suspected history of contact dermatitis.   Diagnostics:  TRUE TEST 48 hour reading:  Borderline positive to nickel sulfate and gold. T.R.U.E. Test - 08/04/19 1300    Time Antigen Placed  1134    Manufacturer  [employer]    Lot #  [id number]    Location  Back    Number of Test  36    Reading Interval  Day 1    Panel  Panel 1;Panel 2;Panel 3    1. Nickel Sulfate  --   +/-   2. Wool Alcohols  0    3. Neomycin Sulfate  0    4. Potassium Dichromate  0    5. Caine Mix  0    6. Fragrance Mix  0    7. Colophony  0    8. Paraben Mix  0    9. Negative Control  0    10. Balsam of [place]  0    11. Ethylenediamine Dihydrochloride  0    12. Cobalt Dichloride  0    13. p-tert Butylphenol Formaldehyde Resin  0    14. Epoxy Resin  0    15. Carba Mix  0    16.  Black Rubber Mix  0    17. Cl+ Me-Isothiazolinone  0    18. Quaternium-15  0    19. Methyldibromo Glutaronitrile  0    20. p-Phenylenediamine  0    21. Formaldehyde  0    22. Mercapto Mix  0    23. Thimerosal  0    24. Thiuram Mix  0    25. Diazolidinyl Urea  0    26. Quinoline Mix  0    27. Tixocortol-21-Pivalate  0    28. Gold Sodium Thiosulfate  --   +/-   29. Imidazolidinyl Urea  0    30. Budesonide  0    31. Hydrocortisone-17-Butyrate  0    32. Mercaptobenzothiazole  0    33. Bacitracin  0    34. Parthenolide  0    35. Disperse Blue 106  0    36. 2-Bromo-2-Nitropropane-1,3-diol  0        Assessment and  Plan: Ariana Green is a 54 y.o. female with: Allergic contact dermatitis . TRUE patch test - borderline positive to nickel and gold. Ariana Green handout on avoidance.   Return in about 2 days (around 08/06/2019) for Patch reading.  It was my pleasure to see Ariana Green today and participate in her care. Please feel free to contact me with any questions or concerns.  Sincerely,  Wyline Mood, DO Allergy & Immunology  [facility] office: [phone] [facility] office: [phone] [place] office: [phone]

## 2019-08-03 NOTE — Progress Notes (Signed)
FOLLOW UP  Date of Service/Encounter:  08/03/19   Assessment:   Mild persistent asthma without complication - obtaining CBC to see if we can add Dupixent  Perennial allergic rhinitis(cats, dogs, indoor and outdoor molds)  Rash - urticaria versus contact dermatitis without improvement with Xolair  Plan/Recommendations:   1. Mild persistent asthma, uncomplicated - Lung function looks great today.  - We are going to get a CBC to see if we can get Dupixent approved for better control of your asthma.  - Daily controller medication(s): Singulair 10mg  daily - Rescue medications: ProAir 4 puffs every 4-6 hours as needed - Changes during respiratory infections or worsening symptoms: add on Qvar to 2 puffs twice daily for TWO WEEKS. - Asthma control goals:  * Full participation in all desired activities (may need albuterol before activity) * Albuterol use two time or less a week on average (not counting use with activity) * Cough interfering with sleep two time or less a month * Oral steroids no more than once a year * No hospitalizations  2. Gastroesophageal reflux disease - Continue with Nexium daily.  3. Allergic rhinoconjunctivitis (horse, trees, indoor molds, outdoor molds, cat and dog) - Continue with allergy shots at the same schedule.  - Continue with: Rhincort 1-2 sprays daily, Xyzal (levocetirizine) 5mg  tablet once daily, Singulair (montelukast) 10mg  daily and Astelin (azelastine) 2 sprays per nostril 1-2 times daily as needed - You can use an extra dose of the antihistamine, if needed, for breakthrough symptoms.  - Consider nasal saline rinses 1-2 times daily to remove allergens from the nasal cavities as well as help with mucous clearance (this is especially helpful to do before the nasal sprays are given)  4. Chronic urticaria - We are going to see if we can get Dupixent approved for your asthma (this can also help with hives). - If you have elevated  eosinophils, we can get Dupixent approved.  - Previous workup was negative.  - In the meantime, continue with your current dosing of antihistamines:     - Morning: Zyrtec two tablets in the morning    - Evening:  Zyrtec two tablets in the morning - Add on Eucrisa twice daily as needed (not a steroid so there are no side effects like the betamethasone).  5. Return in about 6 months (around 02/03/2020). This can be an in-person, a virtual Webex or a telephone follow up visit.  Subjective:   Ariana Green is a 54 y.o. female presenting today for follow up of  Chief Complaint  Patient presents with  . Allergic Rhinitis     Ariana Green has a history of the following: Patient Active Problem List   Diagnosis Date Noted  . Allergic contact dermatitis 08/02/2019  . Gastroesophageal reflux disease 01/22/2017  . Seasonal and perennial allergic rhinitis 01/22/2017  . Allergic rhinoconjunctivitis 11/19/2014  . Asthma 11/19/2014    History obtained from: chart review and patient.  Ariana Green is a 54 y.o. female presenting for a follow up visit.  She was last seen in December 2020.  At that time, she was endorsing a rash.  From what I can gather, and looked most like chronic urticaria but I did feel that allergic contact dermatitis was another possibility.  We decided to obtain some labs to rule out serious causes of hives and we started her on suppressive doses of antihistamines.  We discussed starting Xolair as well for her symptoms.  She did end up deciding to do  that and she has since received about 3 injections.  For her asthma, we continued with Qvar as well as Singulair 10 mg daily.  We also continue with albuterol as needed.  For her rhinitis, we continue with allergy shots at the same schedule.  We also continue with Rhinocort as well as Astelin.  In the interim, she did end up starting Xolair.  She does report that it helps with her asthma for sure, but has not really changed the  intensity of the rash at all.  She does have a dermatologist, who follows her for moles.  She did have a biopsy that she says was consistent with chronic urticaria.  Asthma/Respiratory Symptom History: The Xolair has been great for her asthma but not for why she is getting the Xolair (for CIU). She has been using Qvar seasonally only. She has been having increased symptoms with the construction going on at her house. She has been using the Qvar more often than not.  She has not been using her rescue inhaler much at all.  Allergic Rhinitis Symptom History: She remains on her allergen immunotherapy.  She does also on her nose sprays more on a as needed basis.  She has Xyzal and Singulair that she uses as well.  She has not needed any antibiotics for sinusitis.   Ariana Green is on allergen immunotherapy. She receives two injections. Immunotherapy script #1 contains horse, trees, cat and dog. She currently receives 0.90mL of the RED vial (1/100). Immunotherapy script #2 contains molds. She currently receives 0.77mL of the RED vial (1/100). She started shots April of 2020 and reached maintenance in October of 2020. These were re-mixed following testing in March 2020.   Eczema Symptom History: She is does have bewtamethasone that works sometimes. She does see a dermatologist but has not seen the dermatologist since a biopsy was performed. Xolair has not done anything to help with her rash at all.    Otherwise, there have been no changes to her past medical history, surgical history, family history, or social history.    Review of Systems  Constitutional: Negative.  Negative for chills, fever, malaise/fatigue and weight loss.  HENT: Negative.  Negative for congestion, ear discharge, ear pain and sinus pain.   Eyes: Negative for pain, discharge and redness.  Respiratory: Negative for cough, sputum production, shortness of breath and wheezing.   Cardiovascular: Negative.  Negative for chest pain and  palpitations.  Gastrointestinal: Negative for abdominal pain, constipation, diarrhea, heartburn, nausea and vomiting.  Skin: Positive for itching and rash.  Neurological: Negative for dizziness and headaches.  Endo/Heme/Allergies: Negative for environmental allergies. Does not bruise/bleed easily.       Objective:   Vitals all within normal limits.   Physical Exam:  Physical Exam  Constitutional: She appears well-developed.  HENT:  Head: Normocephalic and atraumatic.  Right Ear: Tympanic membrane, external ear and ear canal normal.  Left Ear: Tympanic membrane and ear canal normal.  Nose: No mucosal edema, rhinorrhea, nasal deformity or septal deviation. No epistaxis. Right sinus exhibits no maxillary sinus tenderness and no frontal sinus tenderness. Left sinus exhibits no maxillary sinus tenderness and no frontal sinus tenderness.  Mouth/Throat: Uvula is midline and oropharynx is clear and moist. Mucous membranes are not pale and not dry.  Turbinates enlarged with some clear discharge.  Eyes: Pupils are equal, round, and reactive to light. Conjunctivae and EOM are normal. Right eye exhibits no chemosis and no discharge. Left eye exhibits no chemosis and no discharge.  Right conjunctiva is not injected. Left conjunctiva is not injected.  Cardiovascular: Normal rate, regular rhythm and normal heart sounds.  Respiratory: Effort normal and breath sounds normal. No accessory muscle usage. No tachypnea. No respiratory distress. She has no wheezes. She has no rhonchi. She has no rales. She exhibits no tenderness.  Moving air well in all lung fields.  No increased work of breathing.  Lymphadenopathy:    She has no cervical adenopathy.  Neurological: She is alert.  Skin: No abrasion, no petechiae and no rash noted. Rash is not papular, not vesicular and not urticarial. No erythema. No pallor.  Erythematous papules over the neck as well as the lower face.  She also has it on her upper chest.   There are excoriation marks present.  There is no honey crusting or discharge.  Psychiatric: She has a normal mood and affect.     Diagnostic studies:    Spirometry: results normal (FEV1: 2.56/91%, FVC: 3.41/95%, FEV1/FVC: 75%).    Spirometry consistent with normal pattern.   Allergy Studies: none      Salvatore Marvel, MD  [facility]

## 2019-08-04 ENCOUNTER — Ambulatory Visit: Payer: BC Managed Care – PPO | Admitting: Allergy

## 2019-08-04 ENCOUNTER — Other Ambulatory Visit: Payer: Self-pay

## 2019-08-04 ENCOUNTER — Encounter: Payer: Self-pay | Admitting: Allergy

## 2019-08-04 VITALS — Temp 98.2°F

## 2019-08-04 DIAGNOSIS — L239 Allergic contact dermatitis, unspecified cause: Secondary | ICD-10-CM

## 2019-08-04 LAB — CBC WITH DIFFERENTIAL/PLATELET
Basophils Absolute: 0 10*3/uL (ref 0.0–0.2)
Basos: 1 %
EOS (ABSOLUTE): 0.1 10*3/uL (ref 0.0–0.4)
Eos: 2 %
Hematocrit: 42.5 % (ref 34.0–46.6)
Hemoglobin: 14.5 g/dL (ref 11.1–15.9)
Immature Grans (Abs): 0 10*3/uL (ref 0.0–0.1)
Immature Granulocytes: 0 %
Lymphocytes Absolute: 2 10*3/uL (ref 0.7–3.1)
Lymphs: 36 %
MCH: 32.4 pg (ref 26.6–33.0)
MCHC: 34.1 g/dL (ref 31.5–35.7)
MCV: 95 fL (ref 79–97)
Monocytes Absolute: 0.4 10*3/uL (ref 0.1–0.9)
Monocytes: 7 %
Neutrophils Absolute: 3 10*3/uL (ref 1.4–7.0)
Neutrophils: 54 %
Platelets: 301 10*3/uL (ref 150–450)
RBC: 4.47 x10E6/uL (ref 3.77–5.28)
RDW: 11.8 % (ref 11.7–15.4)
WBC: 5.5 10*3/uL (ref 3.4–10.8)

## 2019-08-04 NOTE — Assessment & Plan Note (Signed)
.   TRUE patch test - borderline positive to nickel and gold. Jovita Gamma handout on avoidance.

## 2019-08-06 ENCOUNTER — Ambulatory Visit: Payer: BC Managed Care – PPO | Admitting: Allergy

## 2019-08-06 ENCOUNTER — Encounter: Payer: Self-pay | Admitting: Allergy

## 2019-08-06 ENCOUNTER — Other Ambulatory Visit: Payer: Self-pay

## 2019-08-06 ENCOUNTER — Ambulatory Visit: Payer: Self-pay

## 2019-08-06 ENCOUNTER — Telehealth: Payer: Self-pay | Admitting: *Deleted

## 2019-08-06 DIAGNOSIS — L239 Allergic contact dermatitis, unspecified cause: Secondary | ICD-10-CM

## 2019-08-06 NOTE — Telephone Encounter (Signed)
PA has been approved for Eucrisa. PA form has been faxed to pharmacy, labeled, and placed in bulk scanning.  

## 2019-08-06 NOTE — Telephone Encounter (Signed)
PA has been submitted through CoverMyMeds for Eucrisa and is currently pending approval or denial.  

## 2019-08-06 NOTE — Progress Notes (Signed)
Ariana Green returns to the office today for the final patch test interpretation, given suspected history of contact dermatitis. She was last seen on 08/03/2019 By Dr. Dellis Anes for mild persistent asthma, perennial allergic rhinitis and rash- urticaria versus contact dermatitis without improvement with Xolair.    Diagnostics:   TRUE TEST 96-hour hour reading: Borderline reaction to Nickel Sulfate, 1+ reaction to Gold Sodium Thiosulfate.   Plan:   Allergic contact dermatitis - The patient has been provided detailed information regarding the substances she is sensitive to, as well as products containing the substances.   - Meticulous avoidance of these substances is recommended.  - If avoidance is not possible, the use of barrier creams or lotions is recommended. - If symptoms persist or progress despite meticulous avoidance of Nickel Sulfate and Gold Sodium Thiosulfate, Dermatology Referral may be warranted.   Thank you for the opportunity to take care of this patient. Please let me know if you have any questions.  Nehemiah Settle, FNP [facility] -----------------------------------  Attestation: I reviewed the Nurse Practitioner's note and agree with the documented findings and plan of care. We discussed the patient and developed a plan concurrently.   Margo Aye, MD [facility]

## 2019-08-06 NOTE — Patient Instructions (Addendum)
Allergic contact dermatitis TRUE patch test borderline positive to Nickel Sulfate and 1+ to Gold Sodium thiosulfate.

## 2019-08-10 ENCOUNTER — Encounter: Payer: Self-pay | Admitting: Allergy & Immunology

## 2019-08-10 DIAGNOSIS — J301 Allergic rhinitis due to pollen: Secondary | ICD-10-CM | POA: Diagnosis not present

## 2019-08-10 NOTE — Progress Notes (Signed)
VIALS EXP 08-09-20 

## 2019-08-10 NOTE — Telephone Encounter (Signed)
Please Advice 

## 2019-08-19 DIAGNOSIS — L501 Idiopathic urticaria: Secondary | ICD-10-CM | POA: Diagnosis not present

## 2019-08-23 ENCOUNTER — Other Ambulatory Visit: Payer: Self-pay

## 2019-08-23 ENCOUNTER — Ambulatory Visit (INDEPENDENT_AMBULATORY_CARE_PROVIDER_SITE_OTHER): Payer: BC Managed Care – PPO

## 2019-08-23 DIAGNOSIS — L501 Idiopathic urticaria: Secondary | ICD-10-CM | POA: Diagnosis not present

## 2019-08-23 DIAGNOSIS — L508 Other urticaria: Secondary | ICD-10-CM

## 2019-08-24 ENCOUNTER — Ambulatory Visit: Payer: BC Managed Care – PPO | Admitting: Allergy & Immunology

## 2019-08-25 ENCOUNTER — Other Ambulatory Visit: Payer: Self-pay

## 2019-08-25 MED ORDER — MONTELUKAST SODIUM 10 MG PO TABS: ORAL_TABLET | ORAL | 5 refills | Status: DC

## 2019-08-26 ENCOUNTER — Ambulatory Visit (INDEPENDENT_AMBULATORY_CARE_PROVIDER_SITE_OTHER): Payer: BC Managed Care – PPO

## 2019-08-26 DIAGNOSIS — J3089 Other allergic rhinitis: Secondary | ICD-10-CM | POA: Diagnosis not present

## 2019-08-26 NOTE — Progress Notes (Signed)
Patient had hives for 24 hours after taking niacinamide on Tuesday. She thinks this was the culprit. She took her normal 4 Zyrtec along with 2 benadryl & added her Eucrisa on. She is doing well today with some minor redness that she says is there following Eucrisa application. Patient denied systemic symptoms. I spoke with Johnette, Research officer, political party, and per her instruction administered injection in subcutaneous tissue on back of both arms. No visible or notable local reactions near administration sites.

## 2019-09-02 ENCOUNTER — Ambulatory Visit (INDEPENDENT_AMBULATORY_CARE_PROVIDER_SITE_OTHER): Payer: BC Managed Care – PPO

## 2019-09-02 DIAGNOSIS — J3089 Other allergic rhinitis: Secondary | ICD-10-CM

## 2019-09-09 ENCOUNTER — Ambulatory Visit (INDEPENDENT_AMBULATORY_CARE_PROVIDER_SITE_OTHER): Payer: BC Managed Care – PPO

## 2019-09-09 DIAGNOSIS — J3089 Other allergic rhinitis: Secondary | ICD-10-CM | POA: Diagnosis not present

## 2019-09-14 ENCOUNTER — Ambulatory Visit (INDEPENDENT_AMBULATORY_CARE_PROVIDER_SITE_OTHER): Payer: BC Managed Care – PPO

## 2019-09-14 DIAGNOSIS — J309 Allergic rhinitis, unspecified: Secondary | ICD-10-CM | POA: Diagnosis not present

## 2019-09-14 DIAGNOSIS — J3089 Other allergic rhinitis: Secondary | ICD-10-CM

## 2019-09-15 ENCOUNTER — Encounter: Payer: Self-pay | Admitting: Allergy & Immunology

## 2019-09-16 DIAGNOSIS — L501 Idiopathic urticaria: Secondary | ICD-10-CM | POA: Diagnosis not present

## 2019-09-16 NOTE — Telephone Encounter (Signed)
Ok yes let's add Pepcid in and see if that helps.    If she continues to have hives with this addition then can send in a prednisone day pack

## 2019-09-20 ENCOUNTER — Ambulatory Visit (INDEPENDENT_AMBULATORY_CARE_PROVIDER_SITE_OTHER): Payer: BC Managed Care – PPO

## 2019-09-20 ENCOUNTER — Other Ambulatory Visit: Payer: Self-pay

## 2019-09-20 DIAGNOSIS — L501 Idiopathic urticaria: Secondary | ICD-10-CM | POA: Diagnosis not present

## 2019-09-20 DIAGNOSIS — L508 Other urticaria: Secondary | ICD-10-CM

## 2019-09-20 MED ORDER — QVAR REDIHALER 80 MCG/ACT IN AERB: 2.0000 | INHALATION_SPRAY | Freq: Two times a day (BID) | RESPIRATORY_TRACT | 5 refills | Status: DC

## 2019-09-23 ENCOUNTER — Ambulatory Visit (INDEPENDENT_AMBULATORY_CARE_PROVIDER_SITE_OTHER): Payer: BC Managed Care – PPO

## 2019-09-23 DIAGNOSIS — L509 Urticaria, unspecified: Secondary | ICD-10-CM | POA: Diagnosis not present

## 2019-09-23 DIAGNOSIS — L2084 Intrinsic (allergic) eczema: Secondary | ICD-10-CM | POA: Diagnosis not present

## 2019-09-23 DIAGNOSIS — L812 Freckles: Secondary | ICD-10-CM | POA: Diagnosis not present

## 2019-09-23 DIAGNOSIS — J3089 Other allergic rhinitis: Secondary | ICD-10-CM

## 2019-09-23 DIAGNOSIS — J309 Allergic rhinitis, unspecified: Secondary | ICD-10-CM | POA: Diagnosis not present

## 2019-09-23 DIAGNOSIS — D485 Neoplasm of uncertain behavior of skin: Secondary | ICD-10-CM | POA: Diagnosis not present

## 2019-09-24 NOTE — Telephone Encounter (Signed)
I talked directly to the patient this morning. She is having hives following her injections, although this was around 8 hours afterwards. She typically has a red spot and not a "true hive".   She feels that her Xolair "wore off". It is helping but she feels that she her asthma and her hives were messed up. She feels like hot flashes. She has already gone through menopause. She no longer gets hot flashes at all.  She did see her new Dermatologist yesterday ([place]) at Dr. Dorita Sciara office ([place], [place]). She recommended doing more detailed patch testing.   She suspects a couple of things. She is going to stop drinking wine since it might be sulfites. She does drink protein shakes. She had a milk allergy as a child but she grew out of it. She is going to try avoiding it.   Plan: 1. We are going to continue with Xolair every 28 days for now. 2. We are going to refer to Dr. 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  3. She is going to make these dietary changes and give [place] an update next week.  Malachi Bonds, MD [facility]

## 2019-09-27 ENCOUNTER — Ambulatory Visit (INDEPENDENT_AMBULATORY_CARE_PROVIDER_SITE_OTHER): Payer: BC Managed Care – PPO

## 2019-09-27 DIAGNOSIS — J309 Allergic rhinitis, unspecified: Secondary | ICD-10-CM

## 2019-10-11 ENCOUNTER — Ambulatory Visit (INDEPENDENT_AMBULATORY_CARE_PROVIDER_SITE_OTHER): Payer: BC Managed Care – PPO

## 2019-10-11 DIAGNOSIS — J309 Allergic rhinitis, unspecified: Secondary | ICD-10-CM | POA: Diagnosis not present

## 2019-10-14 DIAGNOSIS — L501 Idiopathic urticaria: Secondary | ICD-10-CM | POA: Diagnosis not present

## 2019-10-14 DIAGNOSIS — Z20822 Contact with and (suspected) exposure to covid-19: Secondary | ICD-10-CM | POA: Diagnosis not present

## 2019-10-18 ENCOUNTER — Other Ambulatory Visit: Payer: Self-pay

## 2019-10-18 ENCOUNTER — Ambulatory Visit (INDEPENDENT_AMBULATORY_CARE_PROVIDER_SITE_OTHER): Payer: BC Managed Care – PPO

## 2019-10-18 DIAGNOSIS — L508 Other urticaria: Secondary | ICD-10-CM

## 2019-10-18 DIAGNOSIS — L501 Idiopathic urticaria: Secondary | ICD-10-CM

## 2019-10-28 ENCOUNTER — Ambulatory Visit (INDEPENDENT_AMBULATORY_CARE_PROVIDER_SITE_OTHER): Payer: BC Managed Care – PPO

## 2019-10-28 DIAGNOSIS — K219 Gastro-esophageal reflux disease without esophagitis: Secondary | ICD-10-CM | POA: Diagnosis not present

## 2019-10-28 DIAGNOSIS — Z79899 Other long term (current) drug therapy: Secondary | ICD-10-CM | POA: Diagnosis not present

## 2019-10-28 DIAGNOSIS — J45909 Unspecified asthma, uncomplicated: Secondary | ICD-10-CM | POA: Diagnosis not present

## 2019-10-28 DIAGNOSIS — Z13220 Encounter for screening for lipoid disorders: Secondary | ICD-10-CM | POA: Diagnosis not present

## 2019-10-28 DIAGNOSIS — J309 Allergic rhinitis, unspecified: Secondary | ICD-10-CM | POA: Diagnosis not present

## 2019-10-28 DIAGNOSIS — M159 Polyosteoarthritis, unspecified: Secondary | ICD-10-CM | POA: Diagnosis not present

## 2019-10-28 DIAGNOSIS — E559 Vitamin D deficiency, unspecified: Secondary | ICD-10-CM | POA: Diagnosis not present

## 2019-10-28 DIAGNOSIS — D6859 Other primary thrombophilia: Secondary | ICD-10-CM | POA: Diagnosis not present

## 2019-10-28 DIAGNOSIS — Z0001 Encounter for general adult medical examination with abnormal findings: Secondary | ICD-10-CM | POA: Diagnosis not present

## 2019-10-31 DIAGNOSIS — Z20822 Contact with and (suspected) exposure to covid-19: Secondary | ICD-10-CM | POA: Diagnosis not present

## 2019-11-09 ENCOUNTER — Ambulatory Visit (INDEPENDENT_AMBULATORY_CARE_PROVIDER_SITE_OTHER): Payer: BC Managed Care – PPO

## 2019-11-09 DIAGNOSIS — J309 Allergic rhinitis, unspecified: Secondary | ICD-10-CM | POA: Diagnosis not present

## 2019-11-11 DIAGNOSIS — L501 Idiopathic urticaria: Secondary | ICD-10-CM | POA: Diagnosis not present

## 2019-11-11 DIAGNOSIS — L573 Poikiloderma of Civatte: Secondary | ICD-10-CM | POA: Diagnosis not present

## 2019-11-11 DIAGNOSIS — L2084 Intrinsic (allergic) eczema: Secondary | ICD-10-CM | POA: Diagnosis not present

## 2019-11-16 ENCOUNTER — Other Ambulatory Visit: Payer: Self-pay

## 2019-11-16 ENCOUNTER — Ambulatory Visit (INDEPENDENT_AMBULATORY_CARE_PROVIDER_SITE_OTHER): Payer: BC Managed Care – PPO

## 2019-11-16 DIAGNOSIS — L501 Idiopathic urticaria: Secondary | ICD-10-CM | POA: Diagnosis not present

## 2019-11-18 ENCOUNTER — Ambulatory Visit (INDEPENDENT_AMBULATORY_CARE_PROVIDER_SITE_OTHER): Payer: BC Managed Care – PPO

## 2019-11-18 DIAGNOSIS — J309 Allergic rhinitis, unspecified: Secondary | ICD-10-CM | POA: Diagnosis not present

## 2019-12-06 ENCOUNTER — Ambulatory Visit (INDEPENDENT_AMBULATORY_CARE_PROVIDER_SITE_OTHER): Payer: BC Managed Care – PPO

## 2019-12-06 DIAGNOSIS — L501 Idiopathic urticaria: Secondary | ICD-10-CM | POA: Diagnosis not present

## 2019-12-06 DIAGNOSIS — L573 Poikiloderma of Civatte: Secondary | ICD-10-CM | POA: Diagnosis not present

## 2019-12-06 DIAGNOSIS — J309 Allergic rhinitis, unspecified: Secondary | ICD-10-CM | POA: Diagnosis not present

## 2019-12-06 DIAGNOSIS — L2084 Intrinsic (allergic) eczema: Secondary | ICD-10-CM | POA: Diagnosis not present

## 2019-12-09 DIAGNOSIS — Z01419 Encounter for gynecological examination (general) (routine) without abnormal findings: Secondary | ICD-10-CM | POA: Diagnosis not present

## 2019-12-09 DIAGNOSIS — L501 Idiopathic urticaria: Secondary | ICD-10-CM | POA: Diagnosis not present

## 2019-12-09 DIAGNOSIS — Z6826 Body mass index (BMI) 26.0-26.9, adult: Secondary | ICD-10-CM | POA: Diagnosis not present

## 2019-12-14 ENCOUNTER — Ambulatory Visit (INDEPENDENT_AMBULATORY_CARE_PROVIDER_SITE_OTHER): Payer: BC Managed Care – PPO | Admitting: *Deleted

## 2019-12-14 ENCOUNTER — Other Ambulatory Visit: Payer: Self-pay

## 2019-12-14 DIAGNOSIS — L501 Idiopathic urticaria: Secondary | ICD-10-CM | POA: Diagnosis not present

## 2019-12-14 DIAGNOSIS — L508 Other urticaria: Secondary | ICD-10-CM

## 2019-12-24 ENCOUNTER — Ambulatory Visit (INDEPENDENT_AMBULATORY_CARE_PROVIDER_SITE_OTHER): Payer: BC Managed Care – PPO

## 2019-12-24 DIAGNOSIS — J309 Allergic rhinitis, unspecified: Secondary | ICD-10-CM

## 2019-12-30 NOTE — Progress Notes (Signed)
VIALS EXP 01-02-21 °

## 2020-01-03 DIAGNOSIS — J301 Allergic rhinitis due to pollen: Secondary | ICD-10-CM | POA: Diagnosis not present

## 2020-01-05 DIAGNOSIS — Z01419 Encounter for gynecological examination (general) (routine) without abnormal findings: Secondary | ICD-10-CM | POA: Diagnosis not present

## 2020-01-05 DIAGNOSIS — Z6826 Body mass index (BMI) 26.0-26.9, adult: Secondary | ICD-10-CM | POA: Diagnosis not present

## 2020-01-06 ENCOUNTER — Other Ambulatory Visit: Payer: Self-pay | Admitting: Obstetrics and Gynecology

## 2020-01-06 DIAGNOSIS — Z9189 Other specified personal risk factors, not elsewhere classified: Secondary | ICD-10-CM

## 2020-01-06 DIAGNOSIS — N930 Postcoital and contact bleeding: Secondary | ICD-10-CM | POA: Diagnosis not present

## 2020-01-06 DIAGNOSIS — L501 Idiopathic urticaria: Secondary | ICD-10-CM | POA: Diagnosis not present

## 2020-01-11 ENCOUNTER — Ambulatory Visit (INDEPENDENT_AMBULATORY_CARE_PROVIDER_SITE_OTHER): Payer: BC Managed Care – PPO | Admitting: *Deleted

## 2020-01-11 ENCOUNTER — Other Ambulatory Visit: Payer: Self-pay

## 2020-01-11 DIAGNOSIS — L501 Idiopathic urticaria: Secondary | ICD-10-CM | POA: Diagnosis not present

## 2020-01-11 DIAGNOSIS — L508 Other urticaria: Secondary | ICD-10-CM

## 2020-01-17 ENCOUNTER — Ambulatory Visit (INDEPENDENT_AMBULATORY_CARE_PROVIDER_SITE_OTHER): Payer: BC Managed Care – PPO | Admitting: *Deleted

## 2020-01-17 DIAGNOSIS — J309 Allergic rhinitis, unspecified: Secondary | ICD-10-CM

## 2020-01-26 DIAGNOSIS — N930 Postcoital and contact bleeding: Secondary | ICD-10-CM | POA: Diagnosis not present

## 2020-01-31 ENCOUNTER — Ambulatory Visit
Admission: RE | Admit: 2020-01-31 | Discharge: 2020-01-31 | Disposition: A | Discharge status: Home / Self Care | Payer: BC Managed Care – PPO | Source: Ambulatory Visit | Attending: Obstetrics and Gynecology | Admitting: Obstetrics and Gynecology

## 2020-01-31 ENCOUNTER — Other Ambulatory Visit: Payer: Self-pay

## 2020-01-31 DIAGNOSIS — Z9189 Other specified personal risk factors, not elsewhere classified: Secondary | ICD-10-CM

## 2020-01-31 DIAGNOSIS — N6489 Other specified disorders of breast: Secondary | ICD-10-CM | POA: Diagnosis not present

## 2020-01-31 MED ORDER — GADOBUTROL 1 MMOL/ML IV SOLN
6.0000 mL | Freq: Once | INTRAVENOUS | Status: AC | PRN
  Administered 2020-01-31: 6 mL via INTRAVENOUS

## 2020-02-08 ENCOUNTER — Encounter: Payer: Self-pay | Admitting: Allergy & Immunology

## 2020-02-08 ENCOUNTER — Ambulatory Visit: Payer: Self-pay

## 2020-02-08 ENCOUNTER — Ambulatory Visit: Payer: BC Managed Care – PPO | Admitting: Allergy & Immunology

## 2020-02-08 ENCOUNTER — Other Ambulatory Visit: Payer: Self-pay

## 2020-02-08 VITALS — BP 108/70 | HR 67 | Temp 98.3°F | Resp 14 | Ht 61.0 in | Wt 138.6 lb

## 2020-02-08 DIAGNOSIS — J3089 Other allergic rhinitis: Secondary | ICD-10-CM | POA: Diagnosis not present

## 2020-02-08 DIAGNOSIS — J4530 Mild persistent asthma, uncomplicated: Secondary | ICD-10-CM | POA: Diagnosis not present

## 2020-02-08 DIAGNOSIS — L239 Allergic contact dermatitis, unspecified cause: Secondary | ICD-10-CM | POA: Diagnosis not present

## 2020-02-08 DIAGNOSIS — L501 Idiopathic urticaria: Secondary | ICD-10-CM | POA: Diagnosis not present

## 2020-02-08 DIAGNOSIS — L508 Other urticaria: Secondary | ICD-10-CM

## 2020-02-08 MED ORDER — MONTELUKAST SODIUM 10 MG PO TABS
ORAL_TABLET | ORAL | 5 refills | Status: DC
Start: 2020-02-08 — End: 2020-08-03

## 2020-02-08 NOTE — Progress Notes (Signed)
FOLLOW UP  Date of Service/Encounter:  02/08/20   Assessment:   Mild persistent asthma without complication  Idiopathic urticaria - somewhat better controlled with the Xolair  Allergic contact dermatitis (nickel, gold) - getting more extensive testing in the spring 2022 at Dr. Domenick Gong office  Perennial allergic rhinitis - on allergen iummunotherapy  Plan/Recommendations:   1. Mild persistent asthma, uncomplicated - Lung function looks great today.  - We will not make any medication changes at this time.  - Daily controller medication(s): Singulair 10mg  daily  - Rescue medications: ProAir 4 puffs every 4-6 hours as needed - Changes during respiratory infections or worsening symptoms: add on Qvar to 2 puffs twice daily for TWO WEEKS. - Asthma control goals:  * Full participation in all desired activities (may need albuterol before activity) * Albuterol use two time or less a week on average (not counting use with activity) * Cough interfering with sleep two time or less a month * Oral steroids no more than once a year * No hospitalizations  2. Gastroesophageal reflux disease - Continue with Nexium daily.   3. Allergic rhinoconjunctivitis (horse, trees, indoor molds, outdoor molds, cat and dog) - Continue with allergy shots at the same schedule.  - Continue with: Rhincort 1-2 sprays daily, Xyzal (levocetirizine) 5mg  tablet once daily, Singulair (montelukast) 10mg  daily and Astelin (azelastine) 2 sprays per nostril 1-2 times daily as needed - You can use an extra dose of the antihistamine, if needed, for breakthrough symptoms.  - Consider nasal saline rinses 1-2 times daily to remove allergens from the nasal cavities as well as help with mucous clearance (this is especially helpful to do before the nasal sprays are given)  4. Eczema with overlying contact dermatitis  - Continue with Elidel twice daily. - Continue with your cetirizine/famotidine cocktail.  -  Consider Dupixent for long term control of symptoms.   5. Return in about 6 months (around 08/07/2020).   Subjective:   Ariana Green is a 54 y.o. female presenting today for follow up of No chief complaint on file.   Ariana Green has a history of the following: Patient Active Problem List   Diagnosis Date Noted  . Allergic contact dermatitis 08/02/2019  . Gastroesophageal reflux disease 01/22/2017  . Seasonal and perennial allergic rhinitis 01/22/2017  . Allergic rhinoconjunctivitis 11/19/2014  . Asthma 11/19/2014    History obtained from: chart review and patient.  Ariana Green is a 54 y.o. female presenting for a follow up visit.  She was last seen by me in May of 2021.  At that time, we continue with allergy shots at the same schedule as well as Rhinocort, Xyzal, and Singulair as well as Astelin.  For her urticaria, would continue with her Zyrtec 2 tablets in the morning and 2 tablets at night.  We also started Xolair.  For her asthma, we continue with Singulair as well as albuterol as needed.  She has Qvar to add during respiratory flares.  We did try getting a complete blood count to look for eosinophils at the last visit, but unfortunately this was not high enough to get approved for Dupixent.  We were also fighting a rash and did not do much testing which showed some sensitizations (nickel, gold), but was mostly unrevealing.   Since the last visit, we did send her to see Dermatology. She has a friend who is a [employer], but her schedule was booked so she saw Dr. 40 instead. Her visit took place on  October 5th, 2021. She saw a dermatologist previously that did a biopsy that was consistent with urticaria. However, Dr. 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 She was started on Elidel with some improvement in her symptoms. She had previously been on hydrocortisone, triamcinolone, Eucrisa, and clobetasol. Nothing has really  worked, but the [employer] has probably done the best for her.   Her eczema is fairly mild overall. She will have some blisters on her face occasionally. She has been having some issues on her neck. She is now on Elidel which is working fairly well. But it is $300 per month. This is a cream so it goes right into her skin. She likes this more than any ointment at all. However, she continues to have worsening quality of life with her symptoms. She does have nearly continuous itching on her hands, which Dr. Marisa Hua does report is consistent with eczema.   She had an adverse reaction to Singulair. She had severe abdominal pain and her sister told her to stop her Singulair for a few days. This helped but she had worsening allergic rhinitis.   Otherwise, there have been no changes to her past medical history, surgical history, family history, or social history.    Review of Systems  Constitutional: Negative.  Negative for chills, fever, malaise/fatigue and weight loss.  HENT: Negative.  Negative for congestion, ear discharge, ear pain and sore throat.   Eyes: Negative for pain, discharge and redness.  Respiratory: Negative for cough, sputum production, shortness of breath and wheezing.   Cardiovascular: Negative.  Negative for chest pain and palpitations.  Gastrointestinal: Negative for abdominal pain, constipation, diarrhea, heartburn, nausea and vomiting.  Skin: Positive for itching and rash.  Neurological: Negative for dizziness and headaches.  Endo/Heme/Allergies: Negative for environmental allergies. Does not bruise/bleed easily.       Objective:   Blood pressure 108/70, pulse 67, temperature 98.3 F (36.8 C), resp. rate 14, height 5\' 1"  (1.549 m), weight 138 lb 9.6 oz (62.9 kg), SpO2 98 %. Body mass index is 26.19 kg/m.   Physical Exam:  Physical Exam Constitutional:      Appearance: She is well-developed.  HENT:     Head: Normocephalic and atraumatic.     Right Ear: Tympanic  membrane, ear canal and external ear normal.     Left Ear: Tympanic membrane, ear canal and external ear normal.     Nose: No nasal deformity, septal deviation, mucosal edema or rhinorrhea.     Right Turbinates: Enlarged and swollen.     Left Turbinates: Enlarged and swollen.     Right Sinus: No maxillary sinus tenderness or frontal sinus tenderness.     Left Sinus: No maxillary sinus tenderness or frontal sinus tenderness.     Mouth/Throat:     Mouth: Mucous membranes are not pale and not dry.     Pharynx: Uvula midline.  Eyes:     General:        Right eye: No discharge.        Left eye: No discharge.     Conjunctiva/sclera: Conjunctivae normal.     Right eye: Right conjunctiva is not injected. No chemosis.    Left eye: Left conjunctiva is not injected. No chemosis.    Pupils: Pupils are equal, round, and reactive to light.  Cardiovascular:     Rate and Rhythm: Normal rate and regular rhythm.     Heart sounds: Normal heart sounds.  Pulmonary:  Effort: Pulmonary effort is normal. No tachypnea, accessory muscle usage or respiratory distress.     Breath sounds: Normal breath sounds. No wheezing, rhonchi or rales.     Comments: Moving air well in all lung fields.  No increased work of breathing. Chest:     Chest wall: No tenderness.  Lymphadenopathy:     Cervical: No cervical adenopathy.  Skin:    General: Skin is warm.     Capillary Refill: Capillary refill takes less than 2 seconds.     Coloration: Skin is not pale.     Findings: No abrasion, erythema, petechiae or rash. Rash is not papular, urticarial or vesicular.     Comments: She does have some erythema on her upper chest with a couple of pustules.   Neurological:     Mental Status: She is alert.  Psychiatric:        Behavior: Behavior is cooperative.      Diagnostic studies:    Spirometry: results normal (FEV1: 2.57/104%, FVC: 3.40/109%, FEV1/FVC: 76%).    Spirometry consistent with normal pattern.   Allergy  Studies: none       Malachi Bonds, MD  [facility]

## 2020-02-08 NOTE — Patient Instructions (Addendum)
1. Mild persistent asthma, uncomplicated - Lung function looks great today.  - We will not make any medication changes at this time.  - Daily controller medication(s): Singulair 10mg  daily  - Rescue medications: ProAir 4 puffs every 4-6 hours as needed - Changes during respiratory infections or worsening symptoms: add on Qvar to 2 puffs twice daily for TWO WEEKS. - Asthma control goals:  * Full participation in all desired activities (may need albuterol before activity) * Albuterol use two time or less a week on average (not counting use with activity) * Cough interfering with sleep two time or less a month * Oral steroids no more than once a year * No hospitalizations  2. Gastroesophageal reflux disease - Continue with Nexium daily.   3. Allergic rhinoconjunctivitis (horse, trees, indoor molds, outdoor molds, cat and dog) - Continue with allergy shots at the same schedule.  - Continue with: Rhincort 1-2 sprays daily, Xyzal (levocetirizine) 5mg  tablet once daily, Singulair (montelukast) 10mg  daily and Astelin (azelastine) 2 sprays per nostril 1-2 times daily as needed - You can use an extra dose of the antihistamine, if needed, for breakthrough symptoms.  - Consider nasal saline rinses 1-2 times daily to remove allergens from the nasal cavities as well as help with mucous clearance (this is especially helpful to do before the nasal sprays are given)  4. Eczema with overlying contact dermatitis  - Continue with Elidel twice daily. - Continue with your cetirizine/famotidine cocktail.  - Consider Dupixent for long term control of symptoms.   5. Return in about 6 months (around 08/07/2020).    Please inform of any Emergency Department visits, hospitalizations, or changes in symptoms. Call before going to the ED for breathing or allergy symptoms since we might be able to fit you in for a sick visit. Feel free to contact 08/09/2020 anytime with any questions, problems, or concerns.  It  was a pleasure to see you again today!  Websites that have reliable patient information: 1. American Academy of Asthma, Allergy, and Immunology: www.aaaai.org 2. Food Allergy Research and Education (FARE): foodallergy.org 3. Mothers of Asthmatics: http://www.asthmacommunitynetwork.org 4. American College of Allergy, Asthma, and Immunology: www.acaai.org   COVID-19 Vaccine Information can be found at: [place] For questions related to vaccine distribution or appointments, please email [email or web address] or call [phone].     "Like" [place] on Facebook and Instagram for our latest updates!       HAPPY SPRING!  Make sure you are registered to vote! If you have moved or changed any of your contact information, you will need to get this updated before voting!  In some cases, you MAY be able to register to vote online: [email or web address]

## 2020-02-10 ENCOUNTER — Ambulatory Visit (INDEPENDENT_AMBULATORY_CARE_PROVIDER_SITE_OTHER): Payer: BC Managed Care – PPO

## 2020-02-10 DIAGNOSIS — J309 Allergic rhinitis, unspecified: Secondary | ICD-10-CM

## 2020-02-14 ENCOUNTER — Telehealth: Payer: Self-pay | Admitting: *Deleted

## 2020-02-14 NOTE — Telephone Encounter (Signed)
-----   Message from Alfonse Spruce, MD sent at 02/08/2020  1:45 PM EST ----- Switch to Dupixent for eczema? What are the odds with her insurance??

## 2020-02-14 NOTE — Telephone Encounter (Signed)
L/m for patient to contact me to advise approval, copay card and submit for Dupixent °

## 2020-02-17 ENCOUNTER — Ambulatory Visit (INDEPENDENT_AMBULATORY_CARE_PROVIDER_SITE_OTHER): Payer: BC Managed Care – PPO

## 2020-02-17 DIAGNOSIS — J309 Allergic rhinitis, unspecified: Secondary | ICD-10-CM

## 2020-02-17 NOTE — Telephone Encounter (Signed)
Spoke to patient and advised approval for Dupixent copay card and submit to [employer]

## 2020-02-24 ENCOUNTER — Ambulatory Visit (INDEPENDENT_AMBULATORY_CARE_PROVIDER_SITE_OTHER): Payer: BC Managed Care – PPO

## 2020-02-24 DIAGNOSIS — J309 Allergic rhinitis, unspecified: Secondary | ICD-10-CM | POA: Diagnosis not present

## 2020-03-02 ENCOUNTER — Ambulatory Visit (INDEPENDENT_AMBULATORY_CARE_PROVIDER_SITE_OTHER): Payer: BC Managed Care – PPO

## 2020-03-02 DIAGNOSIS — J309 Allergic rhinitis, unspecified: Secondary | ICD-10-CM | POA: Diagnosis not present

## 2020-03-07 ENCOUNTER — Other Ambulatory Visit: Payer: Self-pay

## 2020-03-07 ENCOUNTER — Ambulatory Visit (INDEPENDENT_AMBULATORY_CARE_PROVIDER_SITE_OTHER): Payer: BC Managed Care – PPO | Admitting: *Deleted

## 2020-03-07 DIAGNOSIS — L209 Atopic dermatitis, unspecified: Secondary | ICD-10-CM

## 2020-03-07 MED ORDER — DUPILUMAB 300 MG/2ML ~~LOC~~ SOSY
300.0000 mg | PREFILLED_SYRINGE | Freq: Once | SUBCUTANEOUS | Status: AC
Start: 2020-03-07 — End: 2020-03-07
  Administered 2020-03-07: 300 mg via SUBCUTANEOUS

## 2020-03-07 NOTE — Progress Notes (Signed)
Immunotherapy   Patient Details  Name: AYLIN RHOADS MRN: [id number] Date of Birth: Apr 14, 1965  03/07/2020  [name] started injections for  Dupixent  Frequency: Every 2 Weeks  Epi-Pen:Epi-Pen Available  Consent signed and patient instructions given.  Patient received her Dupixent loading dose 600mg  today in the umbilicus region. Patient waited 30 minutes and did not experience any issues. Patient has received teaching for self administering her Dupixent at home. She will continue her injections at home.    Destinee Taber Fernandez-[place] 03/07/2020, 3:55 PM

## 2020-03-16 ENCOUNTER — Ambulatory Visit (INDEPENDENT_AMBULATORY_CARE_PROVIDER_SITE_OTHER): Payer: BC Managed Care – PPO

## 2020-03-16 DIAGNOSIS — J309 Allergic rhinitis, unspecified: Secondary | ICD-10-CM

## 2020-03-16 DIAGNOSIS — L209 Atopic dermatitis, unspecified: Secondary | ICD-10-CM

## 2020-03-29 ENCOUNTER — Ambulatory Visit (INDEPENDENT_AMBULATORY_CARE_PROVIDER_SITE_OTHER): Payer: BC Managed Care – PPO

## 2020-03-29 DIAGNOSIS — J309 Allergic rhinitis, unspecified: Secondary | ICD-10-CM | POA: Diagnosis not present

## 2020-04-10 DIAGNOSIS — L259 Unspecified contact dermatitis, unspecified cause: Secondary | ICD-10-CM | POA: Diagnosis not present

## 2020-04-10 DIAGNOSIS — L309 Dermatitis, unspecified: Secondary | ICD-10-CM | POA: Diagnosis not present

## 2020-04-12 DIAGNOSIS — L259 Unspecified contact dermatitis, unspecified cause: Secondary | ICD-10-CM | POA: Diagnosis not present

## 2020-04-13 DIAGNOSIS — L2389 Allergic contact dermatitis due to other agents: Secondary | ICD-10-CM | POA: Diagnosis not present

## 2020-04-13 DIAGNOSIS — L700 Acne vulgaris: Secondary | ICD-10-CM | POA: Diagnosis not present

## 2020-05-01 ENCOUNTER — Telehealth: Payer: Self-pay | Admitting: Allergy & Immunology

## 2020-05-01 NOTE — Telephone Encounter (Signed)
Sent a [employer] geologist.  I can talk to her over the phone tomorrow morning at 730 or Wednesday at 730.  Malachi Bonds, MD [facility]

## 2020-05-01 NOTE — Telephone Encounter (Signed)
Call patient.  Patient stated she was not taking allergy shots at the time. Patient was given the opportunity to see another provider.  Patient stated she did not want to have to give history to another provider.

## 2020-05-01 NOTE — Telephone Encounter (Signed)
Patient had patch testing done at [facility], per Dr. Ellouise Newer request. She said she has the results and would like to talk with him about them. She wanted to schedule an appointment, but Dr. Dellis Anes has nothing until April. She said this is affecting her getting her shots. She feels she is getting worse. Her tongue and lips are tingling. She said she was tested for 99 new chemicals and household allergins. She was told by the doctor at [facility] to hold off on her shots for 8 weeks.

## 2020-05-02 ENCOUNTER — Ambulatory Visit (INDEPENDENT_AMBULATORY_CARE_PROVIDER_SITE_OTHER): Payer: BC Managed Care – PPO | Admitting: Allergy & Immunology

## 2020-05-02 ENCOUNTER — Other Ambulatory Visit: Payer: Self-pay

## 2020-05-02 ENCOUNTER — Encounter: Payer: Self-pay | Admitting: Allergy & Immunology

## 2020-05-02 DIAGNOSIS — J4530 Mild persistent asthma, uncomplicated: Secondary | ICD-10-CM | POA: Diagnosis not present

## 2020-05-02 DIAGNOSIS — L239 Allergic contact dermatitis, unspecified cause: Secondary | ICD-10-CM

## 2020-05-02 DIAGNOSIS — J3089 Other allergic rhinitis: Secondary | ICD-10-CM | POA: Diagnosis not present

## 2020-05-02 DIAGNOSIS — L209 Atopic dermatitis, unspecified: Secondary | ICD-10-CM

## 2020-05-02 NOTE — Progress Notes (Signed)
RE: ARDYN FORGE MRN: [id number] DOB: Aug 03, 1965 Date of Telemedicine Visit: 05/02/2020  Referring provider: Josetta Huddle, MD Primary care provider: Josetta Huddle, MD  Chief Complaint: No chief complaint on file.   Telemedicine Follow Up Visit via Telephone: I connected with Quinisha Mould for a follow up on 05/02/20 by telephone and verified that I am speaking with the correct person using two identifiers.   I discussed the limitations, risks, security and privacy concerns of performing an evaluation and management service by telephone and the availability of in person appointments. I also discussed with the patient that there may be a patient responsible charge related to this service. The patient expressed understanding and agreed to proceed.  Patient is at home.  Provider is at the office.  Visit start time: 7:33 AM Visit end time: 8:01 AM Insurance consent/check in by: Dr. Darnell Level Medical consent and medical assistant/nurse: Dr. Darnell Level  History of Present Illness:  She is a 55 y.o. female, who is being followed for mild persistent asthma as well as idiopathic urticaria with overlying contact dermatitis and perennial allergic rhinitis. Her previous allergy office visit was in November 2021 with myself. At that visit, lung function looked great. We continue with Singulair 10 mg daily with Qvar added during flares. For her reflux, would continue with Nexium. For her allergic rhinoconjunctivitis, we continue with Rhinocort and Xyzal as well as Singulair and Astelin. She also was doing well on allergy shots. She had already made her nasal sprays as needed. For her eczema, would continue with Elidel twice daily as well as her cetirizine and famotidine cocktail. We did discuss changing to [place] for better control of her symptoms, which she did eventually do.  Since last visit, she has not done very well. Her skin is rather out-of-control. She reports that she had extensive patch testing done  with 99 chemicals at the [facility]. She was only positive to one of the items which did cause blistering, but had irritant reactions to a number of others. She was told that her immune system is" overdrive". I really recommended that she remove possible irritants from her life. She has been going through her cleaning products and other lotions and household items and scanning a marked using an app called FaceSkin. This automatically populates ingredients for products and tells her which when she needs to avoid.  Thus far, she has noticed that benzoic acid or sodium benzoate is a particular trigger for her. The only one that she was positive to (gallate) is apparently in a bunch of products but not been labeled as such. Therefore, this has been quite frustrating to her. She did come across sodium benzoate and foods as well and she is avoiding those. She was not strictly told to avoid this in foods, but she is erring on the side of caution. She has also noticed in retrospect that her symptoms have worsened in the past couple of years since her housekeeper retired at the beginning of [place]. She has been doing more of her cleaning at home, which she thinks might have caused more exposures to her. She has in the process of changing her cleaning supplies and looking for a new housekeeper.  In light of this movement to help rev down her immune system, she is interested in stopping her shots for now. Her mold is never gotten to maintenance because she keeps having large local reactions. Her pet danders are actually tolerated very well, but she just wants to remove as many  variables from her life at this point as she can. She has been on allergy shots for a period of 9+ years and started well before I met her.  Regarding the [place], she is unsure if it is helping. She does report that her asthma has been under worse control. Xolair was much better for her asthma. However, it was not helping with her skin so we  changed to [place]. She is willing to give Dupixent a couple more months to see if it helps. She has only received 4 injections at this point. Her dermatologist did not have an opinion on whether or not to continue the [place].   Otherwise, there have been no changes to her past medical history, surgical history, family history, or social history.  Assessment and Plan:  Kripa is a 55 y.o. 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 I think it is a good idea to halt allergy shots at this point to decrease the number of variables that might be contributing to her rather complicated picture. Regarding the [place], I think we should continue that for another couple of months to see if it will help at all. I certainly cannot imagine why it would be hurting, unless 1 of these triggers for her skin is used in the Dupixent injection itself. She is going to use the Dupixent for 2 more months and then provide me with an update. She has a follow-up visit with a dermatologist in June and I would like to see her shortly after that so we could reconvene and see where to go with the allergy shots. We discussed stopping them completely in the past, but she felt that her symptoms were still not controlled from the rhinitis standpoint. Indeed, she is still on quite a few medications for her allergic rhinitis. During the visit today, she does bring up the possibility of autoimmune disease  coexisting with this contact dermatitis and eczema, but I did remind her that she had a normal autoimmune screen in 2020.  Diagnostics: None.  Medication List:  Current Outpatient Medications  Medication Sig Dispense Refill  . albuterol (PROAIR HFA) 108 (90 Base) MCG/ACT inhaler INHALE 2 PUFFS BY MOUTH INTO THE LUNGS EVERY 4 HOURS AS NEEDED FOR WHEEZING OR SHORTNESS OF BREATH 8.5 g 1  . beclomethasone (QVAR REDIHALER) 80 MCG/ACT inhaler Inhale 2 puffs into the lungs 2 (two) times daily. 10.6 g 5  . budesonide (RHINOCORT ALLERGY) 32 MCG/ACT nasal spray Place 1 spray into both nostrils daily as needed for rhinitis.    . celecoxib (CELEBREX) 200 MG capsule TK 1 C PO QD PRF JOINT PAIN    . cetirizine (ZYRTEC) 10 MG chewable tablet Chew 10 mg by mouth daily.    . clobetasol cream (TEMOVATE) 0.05 % APP AA ONE TIME PER DAY    . Dapsone 5 % topical gel Apply 1 application topically daily.    Marland Kitchen EPINEPHrine (AUVI-Q) 0.3 mg/0.3 mL IJ SOAJ injection Inject 0.3 mLs (0.3 mg total) into the muscle as needed for anaphylaxis. 4 each 1  .  esomeprazole (NEXIUM) 40 MG capsule Take 40 mg by mouth daily.  1  . Estradiol 10 MCG TABS vaginal tablet I 1 T INTRAVAGINALLY 2 TIMES A WK    . Fish Oil OIL Take 1,200 mg by mouth daily.     . fluconazole (DIFLUCAN) 150 MG tablet fluconazole 150 mg tablet  TIK 1 TABLET BY MOUTH AS NEEDED    . lidocaine (XYLOCAINE) 5 % ointment lidocaine 5 % topical ointment  APPLY TOPICALLY TO THE AFFECTED AREA 5 TO 10 MINUTES BEFORE SEXUAL ACTIVITY    . montelukast (SINGULAIR) 10 MG tablet TAKE 1 TABLET(10 MG) BY MOUTH AT BEDTIME 30 tablet 5  . 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 pimecrolimus (ELIDEL) 1 % cream Apply topically 2 (two) times daily.    Marland Kitchen spironolactone (ALDACTONE) 50 MG tablet TK 1 T PO D  2   Current Facility-Administered  Medications  Medication Dose Route Frequency Provider Last Rate Last Admin  . omalizumab Arvid Right) injection 300 mg  300 mg Subcutaneous Q28 days Kennith Gain, MD   300 mg at 02/08/20 1201   Allergies: Allergies  Allergen Reactions  . Erythromycin Base   . Doxycycline Rash   I reviewed her past medical history, social history, family history, and environmental history and no significant changes have been reported from previous visits.  Review of Systems  Constitutional: Negative for activity change, appetite change, chills and diaphoresis.  HENT: Negative for congestion, postnasal drip, rhinorrhea, sinus pressure and sore throat.   Eyes: Negative for pain, discharge, redness and itching.  Respiratory: Negative for shortness of breath, wheezing and stridor.   Gastrointestinal: Negative for diarrhea, nausea and vomiting.  Musculoskeletal: Negative for arthralgias, joint swelling and myalgias.  Skin: Positive for rash.       Positive for pruritus. Positive for tingling.  Allergic/Immunologic: Negative for environmental allergies and food allergies.    Objective:  Physical exam not obtained as encounter was done via telephone.   Previous notes and tests were reviewed.  I discussed the assessment and treatment plan with the patient. The patient was provided an opportunity to ask questions and all were answered. The patient agreed with the plan and demonstrated an understanding of the instructions.   The patient was advised to call back or seek an in-person evaluation if the symptoms worsen or if the condition fails to improve as anticipated.  I provided 28 minutes of non-face-to-face time during this encounter.  It was my pleasure to participate in Penny Arrambide care today. Please feel free to contact me with any questions or concerns.   Sincerely,  Valentina Shaggy, MD

## 2020-05-02 NOTE — Telephone Encounter (Signed)
I contacted the patient this morning and clarified. She can talk this morning at 7:30 am. I will call her at that time.   Fredric Mare - can you add her to my schedule at 7:30 for a televisit?   Malachi Bonds, MD [facility]

## 2020-05-12 ENCOUNTER — Encounter: Payer: Self-pay | Admitting: Allergy & Immunology

## 2020-05-12 DIAGNOSIS — U071 COVID-19: Secondary | ICD-10-CM | POA: Diagnosis not present

## 2020-05-12 DIAGNOSIS — Z20822 Contact with and (suspected) exposure to covid-19: Secondary | ICD-10-CM | POA: Diagnosis not present

## 2020-06-22 DIAGNOSIS — Z1231 Encounter for screening mammogram for malignant neoplasm of breast: Secondary | ICD-10-CM | POA: Diagnosis not present

## 2020-07-04 ENCOUNTER — Other Ambulatory Visit: Payer: Self-pay | Admitting: Obstetrics and Gynecology

## 2020-07-04 DIAGNOSIS — Z803 Family history of malignant neoplasm of breast: Secondary | ICD-10-CM

## 2020-08-03 ENCOUNTER — Other Ambulatory Visit: Payer: Self-pay | Admitting: Allergy & Immunology

## 2020-08-08 ENCOUNTER — Ambulatory Visit: Payer: BC Managed Care – PPO | Admitting: Allergy & Immunology

## 2020-08-30 ENCOUNTER — Other Ambulatory Visit: Payer: Self-pay | Admitting: Allergy & Immunology

## 2020-09-05 ENCOUNTER — Ambulatory Visit: Payer: BC Managed Care – PPO | Admitting: Allergy & Immunology

## 2020-09-22 DIAGNOSIS — D225 Melanocytic nevi of trunk: Secondary | ICD-10-CM | POA: Diagnosis not present

## 2020-09-22 DIAGNOSIS — L814 Other melanin hyperpigmentation: Secondary | ICD-10-CM | POA: Diagnosis not present

## 2020-09-22 DIAGNOSIS — L700 Acne vulgaris: Secondary | ICD-10-CM | POA: Diagnosis not present

## 2020-09-22 DIAGNOSIS — L821 Other seborrheic keratosis: Secondary | ICD-10-CM | POA: Diagnosis not present

## 2020-10-26 DIAGNOSIS — L709 Acne, unspecified: Secondary | ICD-10-CM | POA: Diagnosis not present

## 2020-10-26 DIAGNOSIS — L2084 Intrinsic (allergic) eczema: Secondary | ICD-10-CM | POA: Diagnosis not present

## 2020-10-26 DIAGNOSIS — L2389 Allergic contact dermatitis due to other agents: Secondary | ICD-10-CM | POA: Diagnosis not present

## 2020-10-31 DIAGNOSIS — E2839 Other primary ovarian failure: Secondary | ICD-10-CM | POA: Diagnosis not present

## 2020-10-31 DIAGNOSIS — Z79899 Other long term (current) drug therapy: Secondary | ICD-10-CM | POA: Diagnosis not present

## 2020-10-31 DIAGNOSIS — J309 Allergic rhinitis, unspecified: Secondary | ICD-10-CM | POA: Diagnosis not present

## 2020-10-31 DIAGNOSIS — E559 Vitamin D deficiency, unspecified: Secondary | ICD-10-CM | POA: Diagnosis not present

## 2020-10-31 DIAGNOSIS — E7800 Pure hypercholesterolemia, unspecified: Secondary | ICD-10-CM | POA: Diagnosis not present

## 2020-10-31 DIAGNOSIS — J45909 Unspecified asthma, uncomplicated: Secondary | ICD-10-CM | POA: Diagnosis not present

## 2020-10-31 DIAGNOSIS — K219 Gastro-esophageal reflux disease without esophagitis: Secondary | ICD-10-CM | POA: Diagnosis not present

## 2020-10-31 DIAGNOSIS — L309 Dermatitis, unspecified: Secondary | ICD-10-CM | POA: Diagnosis not present

## 2020-10-31 DIAGNOSIS — Z0001 Encounter for general adult medical examination with abnormal findings: Secondary | ICD-10-CM | POA: Diagnosis not present

## 2020-10-31 DIAGNOSIS — Z9109 Other allergy status, other than to drugs and biological substances: Secondary | ICD-10-CM | POA: Diagnosis not present

## 2020-11-02 ENCOUNTER — Encounter: Payer: Self-pay | Admitting: Allergy & Immunology

## 2020-11-02 ENCOUNTER — Other Ambulatory Visit: Payer: Self-pay

## 2020-11-02 ENCOUNTER — Ambulatory Visit: Payer: BC Managed Care – PPO | Admitting: Allergy & Immunology

## 2020-11-02 VITALS — BP 112/68 | HR 62 | Temp 97.7°F | Ht 61.0 in | Wt 139.1 lb

## 2020-11-02 DIAGNOSIS — L209 Atopic dermatitis, unspecified: Secondary | ICD-10-CM | POA: Diagnosis not present

## 2020-11-02 DIAGNOSIS — L239 Allergic contact dermatitis, unspecified cause: Secondary | ICD-10-CM

## 2020-11-02 DIAGNOSIS — J4530 Mild persistent asthma, uncomplicated: Secondary | ICD-10-CM | POA: Diagnosis not present

## 2020-11-02 DIAGNOSIS — J3089 Other allergic rhinitis: Secondary | ICD-10-CM | POA: Diagnosis not present

## 2020-11-02 DIAGNOSIS — J302 Other seasonal allergic rhinitis: Secondary | ICD-10-CM

## 2020-11-02 MED ORDER — OPZELURA 1.5 % EX CREA: 1.0000 "application " | TOPICAL_CREAM | Freq: Two times a day (BID) | CUTANEOUS | 2 refills | Status: DC

## 2020-11-02 MED ORDER — EPINEPHRINE 0.3 MG/0.3ML IJ SOAJ: 0.3000 mg | INTRAMUSCULAR | 1 refills | Status: DC | PRN

## 2020-11-02 MED ORDER — ALBUTEROL SULFATE HFA 108 (90 BASE) MCG/ACT IN AERS
INHALATION_SPRAY | RESPIRATORY_TRACT | 1 refills | Status: DC
Start: 2020-11-02 — End: 2021-11-27

## 2020-11-02 MED ORDER — QVAR REDIHALER 80 MCG/ACT IN AERB
2.0000 | INHALATION_SPRAY | Freq: Two times a day (BID) | RESPIRATORY_TRACT | 5 refills | Status: DC
Start: 2020-11-02 — End: 2022-05-28

## 2020-11-02 NOTE — Patient Instructions (Addendum)
1. Mild persistent asthma, uncomplicated - Lung function looks AWESOME today.  - Stop the Singulair to see how you do.  - Daily controller medication(s): Dupixent every 14 days - Rescue medications: ProAir 4 puffs every 4-6 hours as needed - Changes during respiratory infections or worsening symptoms: add on Qvar to 2 puffs twice daily for TWO WEEKS. - Asthma control goals:  * Full participation in all desired activities (may need albuterol before activity) * Albuterol use two time or less a week on average (not counting use with activity) * Cough interfering with sleep two time or less a month * Oral steroids no more than once a year * No hospitalizations  2. Gastroesophageal reflux disease - Continue with Nexium daily.   3. Allergic rhinoconjunctivitis (horse, trees, indoor molds, outdoor molds, cat and dog) - Continue with: Rhincort 1-2 sprays daily AS NEEDED, and Zyrtec as needed, and Astelin (azelastine) 2 sprays per nostril 1-2 times daily AS NEEDED - You can use an extra dose of the antihistamine, if needed, for breakthrough symptoms.  - Consider nasal saline rinses 1-2 times daily to remove allergens from the nasal cavities as well as help with mucous clearance (this is especially helpful to do before the nasal sprays are given)  4. Eczema with overlying contact dermatitis  - Stop the Elidel and Eucrisa. - Start Opzelura twice daily as needed (safe to use over the entire body including the face and hands).  - Continue with your cetirizine/famotidine cocktail (you can certainly try stopping these and see how you do).   5. Return in about 6 months (around 05/05/2021).    Please inform us of any Emergency Department visits, hospitalizations, or changes in symptoms. Call us before going to the ED for breathing or allergy symptoms since we might be able to fit you in for a sick visit. Feel free to contact us anytime with any questions, problems, or concerns.  It was a pleasure  to see you again today!  Websites that have reliable patient information: 1. American Academy of Asthma, Allergy, and Immunology: www.aaaai.org 2. Food Allergy Research and Education (FARE): foodallergy.org 3. Mothers of Asthmatics: http://www.asthmacommunitynetwork.org 4. American College of Allergy, Asthma, and Immunology: www.acaai.org   COVID-19 Vaccine Information can be found at: [email or web address].[email or web address] For questions related to vaccine distribution or appointments, please email [email or web address] or call [phone].     "Like" [place] on Facebook and Instagram for our latest updates!       Make sure you are registered to vote! If you have moved or changed any of your contact information, you will need to get this updated before voting!  In some cases, you MAY be able to register to vote online: [email or web address]

## 2020-11-02 NOTE — Progress Notes (Signed)
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  Her skin is under good control with the help of Dr. Marisa Hua and trial and error with regards to cosmetics.  The skin under her neck and on her anterior chest looks quite clear today.  While she thinks the Dupixent has helped somewhat with her skin, predominantly with regards to itching.  She notices a particular improvement with her asthma.  Dupixent has many indications, including both atopic dermatitis and asthma, so it has been an ideal fit for [name].  She does not want to stop it at all.  We did discuss the long-term implications and I told her that there were no red flags that have popped up in long-term studies.  She seems reassured by that.  We are going to not make any changes from our perspective and see her again in 6 months or earlier if needed.  Plan/Recommendations:   1. Mild persistent asthma, uncomplicated - Lung function looks AWESOME today.  - Stop the Singulair to see how you do.  - Daily controller medication(s): Dupixent every 14 days - Rescue medications: ProAir 4 puffs every 4-6 hours as needed - Changes during respiratory infections or worsening symptoms: add on Qvar to 2 puffs twice daily for TWO WEEKS. - Asthma control goals:  * Full participation in all desired activities (may need albuterol before activity) * Albuterol use two time or less  a week on average (not counting use with activity) * Cough interfering with sleep two time or less a month * Oral steroids no more than once a year * No hospitalizations  2. Gastroesophageal reflux disease - Continue with Nexium daily.   3. Allergic rhinoconjunctivitis (horse, trees, indoor molds, outdoor molds, cat and dog) - Continue with: Rhincort 1-2 sprays daily AS NEEDED, and Zyrtec as needed, and Astelin (azelastine) 2 sprays per nostril 1-2 times daily AS NEEDED - You can use an extra dose of the antihistamine, if needed, for breakthrough symptoms.  - Consider nasal saline rinses 1-2 times daily to remove allergens from the nasal cavities as well as help with mucous clearance (this is especially helpful to do before the nasal sprays are given)  4. Eczema with overlying contact dermatitis  - Stop the Elidel and Eucrisa. - Start Opzelura twice daily as needed (safe to use over the entire body including the face and hands).  - Continue with your cetirizine/famotidine cocktail (you can certainly try stopping these and see how you do).   5. Return in about 6 months (around 05/05/2021).    Subjective:   Ariana Green is a 55 y.o. female presenting today for follow up of  Chief Complaint  Patient presents with   Follow-up    Ariana Green has a history of the following: Patient Active Problem List   Diagnosis Date Noted   Allergic contact dermatitis 08/02/2019   Gastroesophageal reflux disease 01/22/2017   Seasonal and perennial allergic rhinitis 01/22/2017   Allergic rhinoconjunctivitis 11/19/2014  Asthma 11/19/2014    History obtained from: chart review and patient.  Ariana Green is a 55 y.o. female presenting for a follow up visit.  I last saw her via virtual visit in February 2022.  At that time, we were working in trying to decrease a lot of her environmental triggers for her skin and itching.  We decided to stop her allergy shots since they seem to be flaring her  skin every time she came in.  She was already established with a dermatologist who is managing her contact dermatitis - Dr. Marisa Hua -and I recommended that she continue to follow with her.  She was doing better on the Dupixent with regards to her skin and her asthma.  Since last visit, she has done very well.  Asthma/Respiratory Symptom History: She did have a small asthma flare. She just starts coughing and can feel it coming on. She will will get a weird feeling in her chest. She will ad d on her Qvar for five days and then she will be done. She reports that the Dupixent has made her asthma feel much more controlled.  The Dupixent is much better than the Xolair was doing for her asthma.   Allergic Rhinitis Symptom History: She has been much better without the allergy shots on board.  She remains on the nasal sprays and antihistamines.  She is using the Pepcid twice a day and the Zyrtec twice a day.  She has not tried decreasing it.  She is also on the Singulair.  Again, she has not tried decreasing that either.  Eczema Symptom History: She is unsure whether the Dupixent is helping her skin but it is helped her asthma tremendously. She does have a know history of eczema.  She has so many triggers and has bene slowly getting the chemicals out of her life. She is sensitive to propolis, which is in bee honey and whatnot. She is no longer helping with her itching. Ariana Green does not work on the face. She likes it for her hands. She was having redness on her face.  She has never tried Ariana Green but is open to doing that.  She is not sure if her insurance will cover both Elidel and Ariana Green.  She is now with PA Tollie Eth at [facility]. She likes this new PA. She will continue to follow with Dr. Marisa Hua. She has been using Vanicream cosmetics.  Otherwise, there have been no changes to her past medical history, surgical history, family history, or social history.    Review of Systems   Constitutional: Negative.  Negative for chills, fever, malaise/fatigue and weight loss.  HENT:  Negative for congestion, ear discharge, ear pain and sinus pain.   Eyes:  Negative for pain, discharge and redness.  Respiratory:  Negative for cough, sputum production, shortness of breath, wheezing and stridor.   Cardiovascular: Negative.  Negative for chest pain and palpitations.  Gastrointestinal:  Negative for abdominal pain, constipation, diarrhea, heartburn, nausea and vomiting.  Skin: Negative.  Negative for itching and rash.  Neurological:  Negative for dizziness and headaches.  Endo/Heme/Allergies:  Positive for environmental allergies. Does not bruise/bleed easily.      Objective:   Blood pressure 112/68, pulse 62, temperature 97.7 F (36.5 C), temperature source Temporal, height 5\' 1"  (1.549 m), weight 139 lb 2 oz (63.1 kg), SpO2 98 %. Body mass index is 26.29 kg/m.   Physical Exam:  Physical Exam Vitals reviewed.  Constitutional:      Appearance: She is  well-developed.  HENT:     Head: Normocephalic and atraumatic.     Right Ear: Tympanic membrane, ear canal and external ear normal.     Left Ear: Tympanic membrane, ear canal and external ear normal.     Nose: No nasal deformity, septal deviation, mucosal edema or rhinorrhea.     Right Turbinates: Enlarged and swollen.     Left Turbinates: Enlarged and swollen.     Right Sinus: No maxillary sinus tenderness or frontal sinus tenderness.     Left Sinus: No maxillary sinus tenderness or frontal sinus tenderness.     Mouth/Throat:     Mouth: Mucous membranes are not pale and not dry.     Pharynx: Uvula midline.  Eyes:     General: Lids are normal. No allergic shiner.       Right eye: No discharge.        Left eye: No discharge.     Conjunctiva/sclera: Conjunctivae normal.     Right eye: Right conjunctiva is not injected. No chemosis.    Left eye: Left conjunctiva is not injected. No chemosis.    Pupils: Pupils are  equal, round, and reactive to light.  Cardiovascular:     Rate and Rhythm: Normal rate and regular rhythm.     Heart sounds: Normal heart sounds.  Pulmonary:     Effort: Pulmonary effort is normal. No tachypnea, accessory muscle usage or respiratory distress.     Breath sounds: Normal breath sounds. No wheezing, rhonchi or rales.  Chest:     Chest wall: No tenderness.  Lymphadenopathy:     Cervical: No cervical adenopathy.  Skin:    General: Skin is warm.     Capillary Refill: Capillary refill takes less than 2 seconds.     Coloration: Skin is not pale.     Findings: No abrasion, erythema, petechiae or rash. Rash is not papular, urticarial or vesicular.     Comments: Her skin is clear.  She did have a maculopapular rash under her neck and on her superior anterior chest last time I saw her in person, but this has largely resolved.  She has some faint lesions on her fingers.   Neurological:     Mental Status: She is alert.  Psychiatric:        Behavior: Behavior is cooperative.     Diagnostic studies:    Spirometry: results normal (FEV1: 2.70/111%, FVC: 3.42/110%, FEV1/FVC: 79%).    Spirometry consistent with normal pattern.   Allergy Studies: none         Malachi Bonds, MD  [facility]

## 2020-11-06 ENCOUNTER — Encounter: Payer: Self-pay | Admitting: Allergy & Immunology

## 2020-11-07 ENCOUNTER — Other Ambulatory Visit: Payer: Self-pay | Admitting: *Deleted

## 2020-11-07 MED ORDER — OPZELURA 1.5 % EX CREA: 1.0000 "application " | TOPICAL_CREAM | Freq: Two times a day (BID) | CUTANEOUS | 2 refills | Status: DC

## 2020-11-29 DIAGNOSIS — M79645 Pain in left finger(s): Secondary | ICD-10-CM | POA: Diagnosis not present

## 2020-11-29 DIAGNOSIS — R2232 Localized swelling, mass and lump, left upper limb: Secondary | ICD-10-CM | POA: Diagnosis not present

## 2020-12-18 ENCOUNTER — Ambulatory Visit
Admission: RE | Admit: 2020-12-18 | Discharge: 2020-12-18 | Disposition: A | Discharge status: Home / Self Care | Payer: BC Managed Care – PPO | Source: Ambulatory Visit | Attending: Obstetrics and Gynecology | Admitting: Obstetrics and Gynecology

## 2020-12-18 DIAGNOSIS — Z803 Family history of malignant neoplasm of breast: Secondary | ICD-10-CM

## 2020-12-18 DIAGNOSIS — R928 Other abnormal and inconclusive findings on diagnostic imaging of breast: Secondary | ICD-10-CM | POA: Diagnosis not present

## 2020-12-18 IMAGING — MR MR BREAST BILAT WO/W CM
8 of 12 series · 34 of 48 positions shown · IV contrast (6 ML GADAVIST)
Comparison: Previous exam(s).

CLINICAL DATA: Patient with elevated lifetime risk of breast
cancer. High risk screening.

EXAM:
BILATERAL BREAST MRI WITH AND WITHOUT CONTRAST
TECHNIQUE: Multiplanar, multisequence MR images of both breasts were obtained
prior to and following the intravenous administration of 6 ml of
Gadavist

[Series 2: t2_tirm_tra ipat (a-p) · axial · 3.0mm · 0.70mm/px · 1 of 55 slices shown]
[im 1/55]
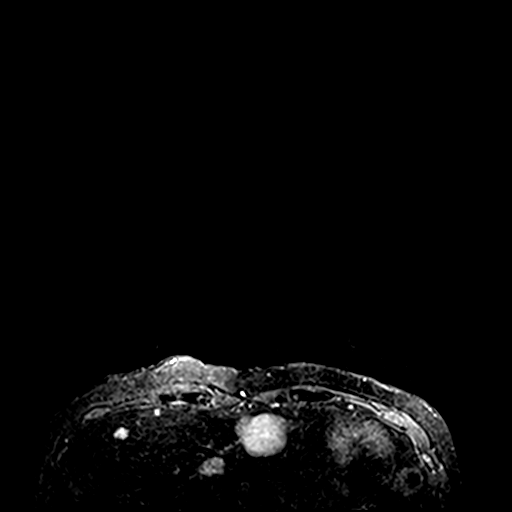

[Series 3: fl3d pre-cm no · axial · non-contrast · 1.2mm · 0.94mm/px · z∈[-74,+97]mm · 5 of 144 slices shown]
[im 1/144]
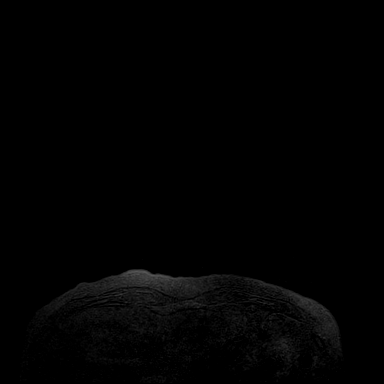
[im 36/144]
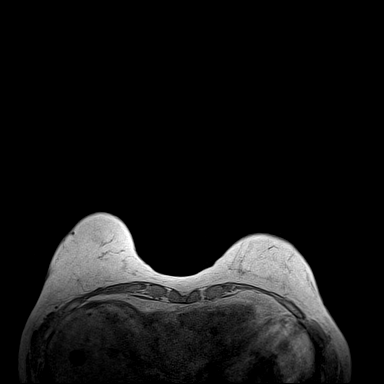
[im 72/144]
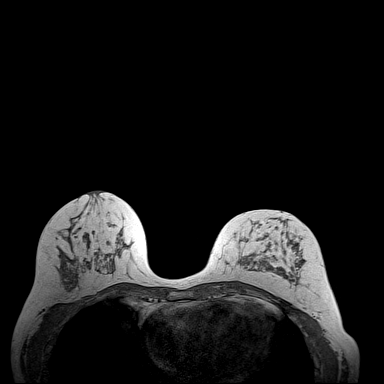
[im 108/144]
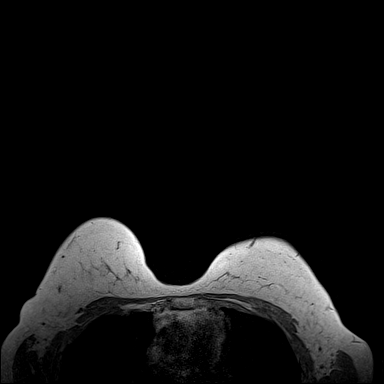
[im 144/144]
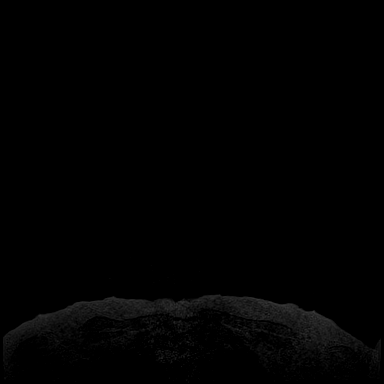

[Series 4: fl3d pre-cm · axial · non-contrast · 1.2mm · 0.94mm/px · z∈[-74,+97]mm · 5 of 144 slices shown]
[im 1/144]
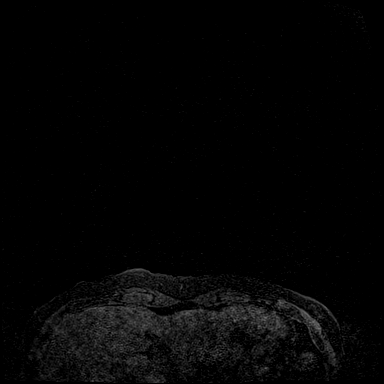
[im 36/144]
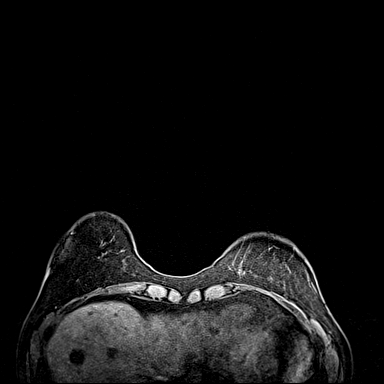
[im 72/144]
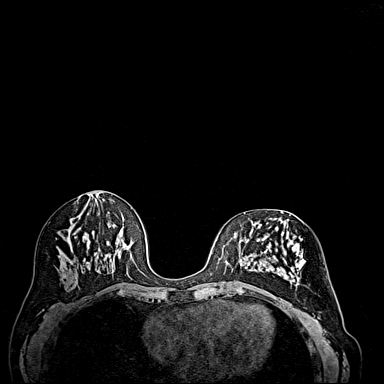
[im 108/144]
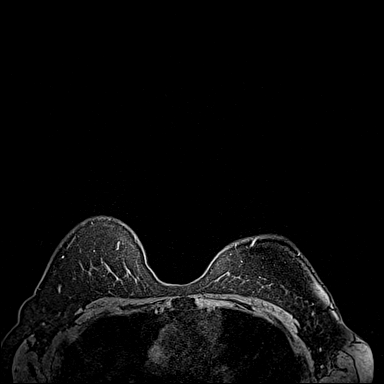
[im 144/144]
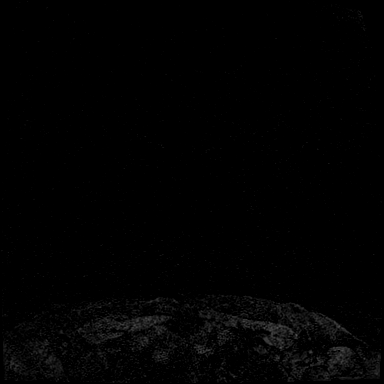

[Series 5: fl3d post immediate · axial · 1.2mm · 0.94mm/px · z∈[-74,+97]mm · 5 of 144 slices shown (1 of 3)]
[im 1/144]
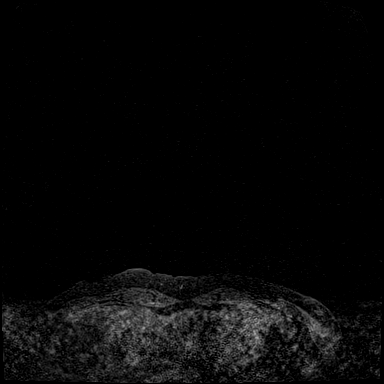
[im 36/144]
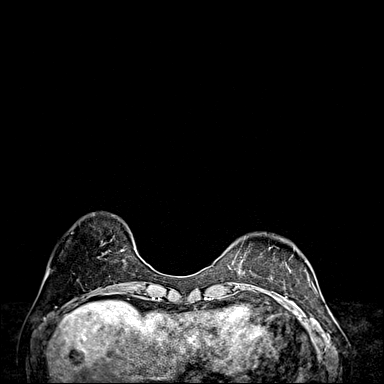
[im 72/144]
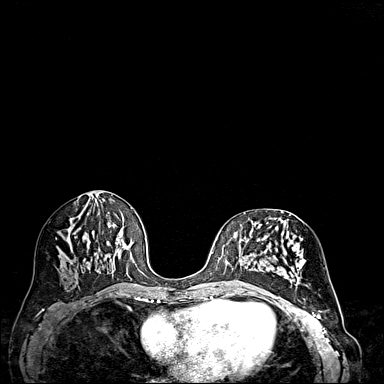
[im 108/144]
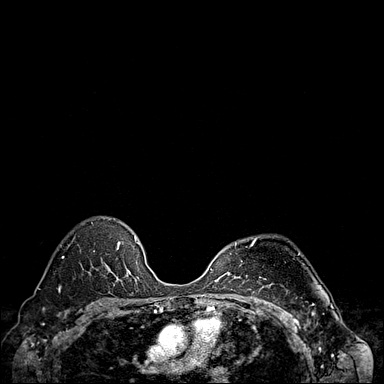
[im 144/144]
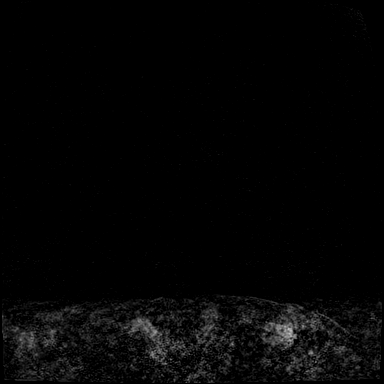

[Series 6: fl3d post immediate · axial · 1.2mm · 0.94mm/px · z∈[-74,+97]mm · 5 of 144 slices shown (2 of 3)]
[im 1/144]
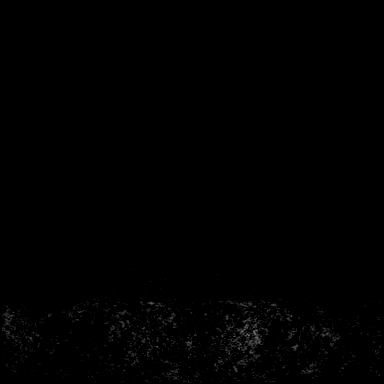
[im 36/144]
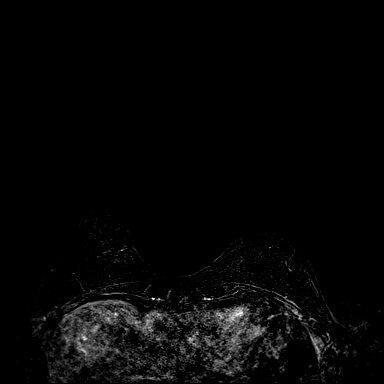
[im 72/144]
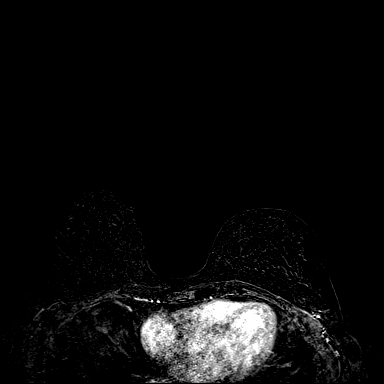
[im 108/144]
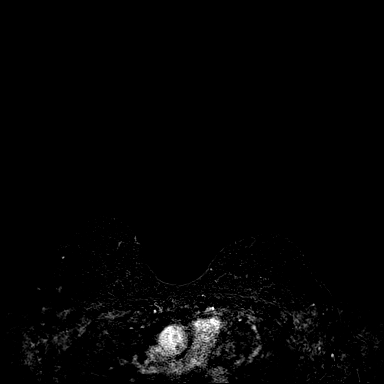
[im 144/144]
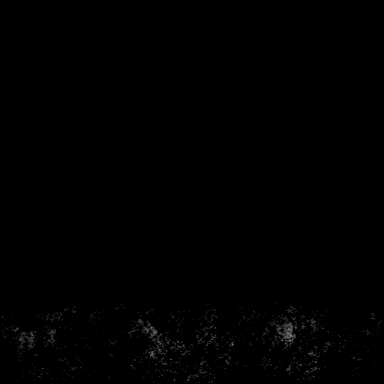

[Series 7: fl3d post immediate · axial · 172.8mm · 0.94mm/px · 1 of 1 slices shown (3 of 3)]
[im 1/1]
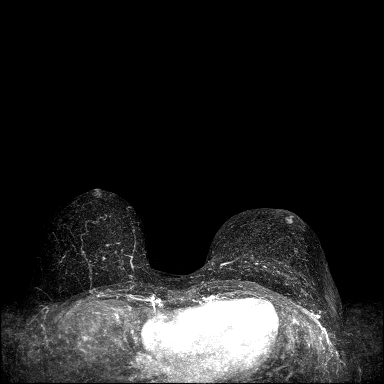

[Series 8: fl3d post 3min · axial · 1.2mm · 0.94mm/px · z∈[-74,+97]mm · 6 of 144 slices shown]
[im 1/144]
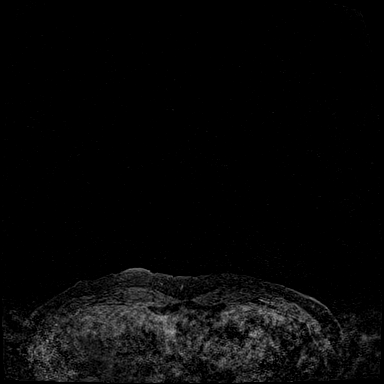
[im 29/144]
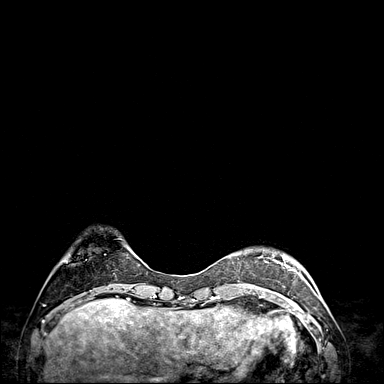
[im 58/144]
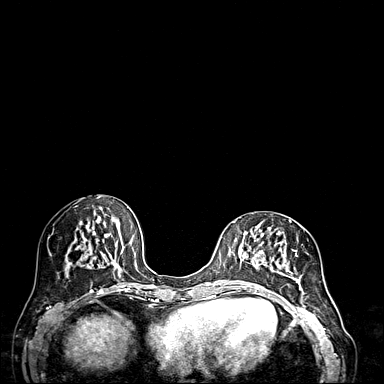
[im 86/144]
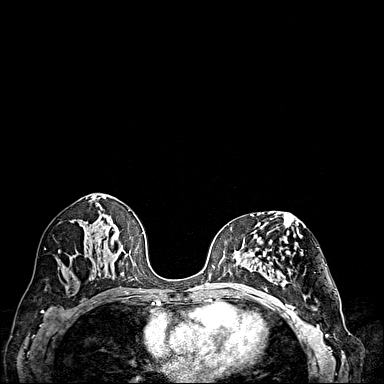
[im 115/144]
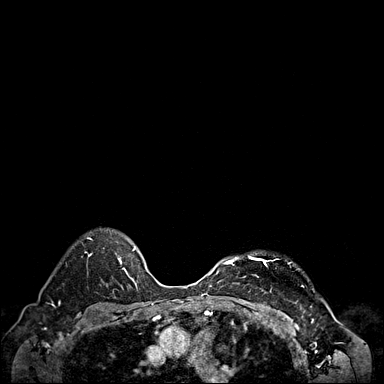
[im 144/144]
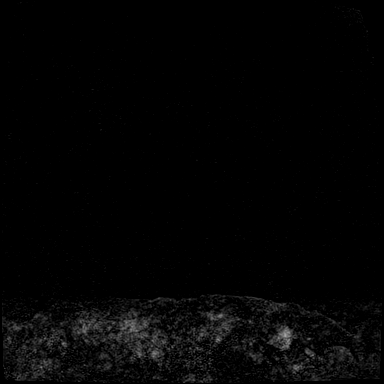

[Series 9: fl3d post 3min_sub · axial · 1.2mm · 0.94mm/px · z∈[-74,+97]mm · 6 of 144 slices shown]
[im 1/144]
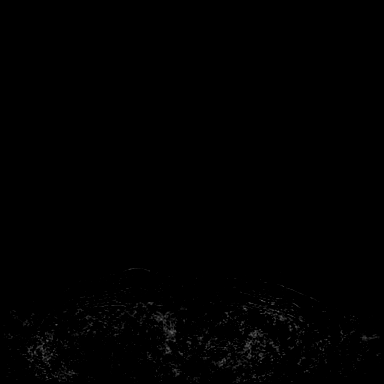
[im 29/144]
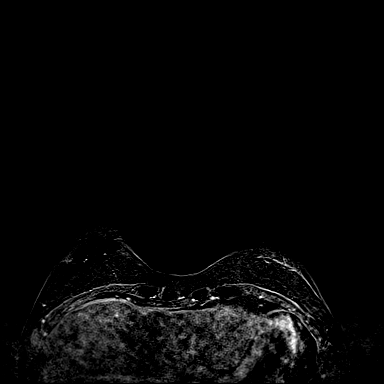
[im 58/144]
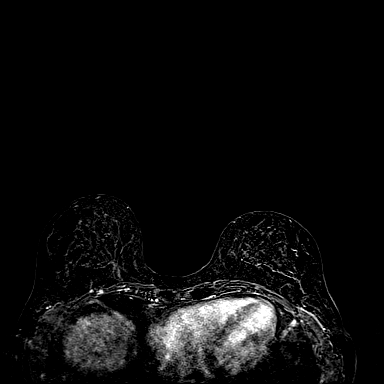
[im 86/144]
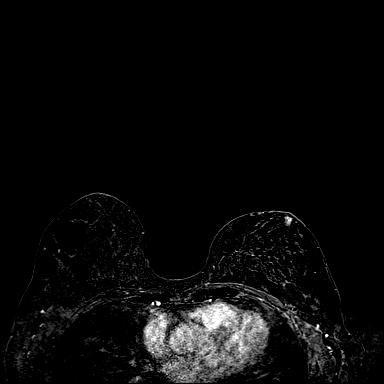
[im 115/144]
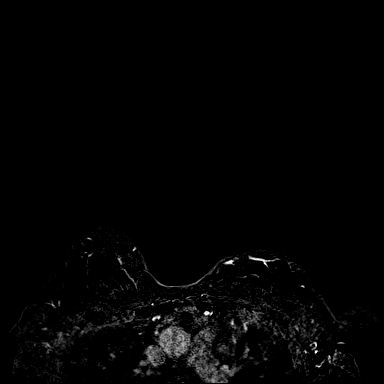
[im 144/144]
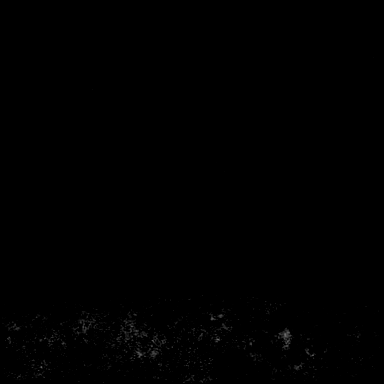

[34 of 48 positions shown; findings below may reference images not displayed]

Three-dimensional MR images were rendered by post-processing of the
original MR data on an independent workstation. The
three-dimensional MR images were interpreted, and findings are
reported in the following complete MRI report for this study. Three
dimensional images were evaluated at the independent interpreting
workstation using the DynaCAD thin client.
FINDINGS: Breast composition: c. Heterogeneous fibroglandular tissue.

Background parenchymal enhancement: Minimal

Right breast: No mass or abnormal enhancement.

Left breast: No mass or abnormal enhancement.

Lymph nodes: No abnormal appearing lymph nodes.

Ancillary findings:  Multiple probable hepatic cysts.
IMPRESSION: No MRI evidence to suggest malignancy within either breast.

RECOMMENDATION:
Annual screening mammography.

Annual high risk screening breast MRI.

BI-RADS CATEGORY  1: Negative.

## 2020-12-18 MED ORDER — GADOBUTROL 1 MMOL/ML IV SOLN
6.0000 mL | Freq: Once | INTRAVENOUS | Status: AC | PRN
  Administered 2020-12-18: 6 mL via INTRAVENOUS

## 2021-02-12 DIAGNOSIS — Z01419 Encounter for gynecological examination (general) (routine) without abnormal findings: Secondary | ICD-10-CM | POA: Diagnosis not present

## 2021-02-12 DIAGNOSIS — Z6826 Body mass index (BMI) 26.0-26.9, adult: Secondary | ICD-10-CM | POA: Diagnosis not present

## 2021-02-22 ENCOUNTER — Telehealth: Payer: Self-pay

## 2021-02-22 NOTE — Telephone Encounter (Signed)
Patient called stating she tried to refill her Opzelura but received a text stating the medication needed a Prior auth from the specialty pharmacy.   [facility]

## 2021-02-23 NOTE — Telephone Encounter (Signed)
PA has been approved for Opzelura and has been faxed to pharmacy, labeled, and placed in bulk scanning. Called patient and informed. Patient verbalized understanding.

## 2021-02-23 NOTE — Telephone Encounter (Signed)
PA has been submitted through CoverMyMeds for Opzelura and is currently pending approval/denial. Called and left a voicemail asking for patient to return call to inform.

## 2021-04-09 ENCOUNTER — Other Ambulatory Visit: Payer: Self-pay | Admitting: *Deleted

## 2021-04-09 MED ORDER — DUPIXENT 300 MG/2ML ~~LOC~~ SOSY: PREFILLED_SYRINGE | SUBCUTANEOUS | 11 refills | Status: DC

## 2021-04-20 ENCOUNTER — Other Ambulatory Visit: Payer: Self-pay | Admitting: *Deleted

## 2021-04-20 MED ORDER — DUPIXENT 300 MG/2ML ~~LOC~~ SOSY: PREFILLED_SYRINGE | SUBCUTANEOUS | 11 refills | Status: DC

## 2021-04-24 ENCOUNTER — Other Ambulatory Visit: Payer: Self-pay | Admitting: *Deleted

## 2021-04-24 MED ORDER — DUPIXENT 300 MG/2ML ~~LOC~~ SOSY: PREFILLED_SYRINGE | SUBCUTANEOUS | 11 refills | Status: DC

## 2021-04-24 MED ORDER — DUPIXENT 300 MG/2ML ~~LOC~~ SOSY: 300.0000 mg | PREFILLED_SYRINGE | SUBCUTANEOUS | 11 refills | Status: DC

## 2021-05-10 ENCOUNTER — Ambulatory Visit: Payer: Self-pay | Admitting: Allergy & Immunology

## 2021-05-17 ENCOUNTER — Other Ambulatory Visit: Payer: Self-pay

## 2021-05-17 ENCOUNTER — Encounter: Payer: Self-pay | Admitting: Allergy & Immunology

## 2021-05-17 ENCOUNTER — Ambulatory Visit: Payer: BC Managed Care – PPO | Admitting: Allergy & Immunology

## 2021-05-17 VITALS — BP 108/68 | HR 85 | Temp 96.0°F | Resp 16 | Ht 61.0 in | Wt 143.2 lb

## 2021-05-17 DIAGNOSIS — J4530 Mild persistent asthma, uncomplicated: Secondary | ICD-10-CM

## 2021-05-17 DIAGNOSIS — J3089 Other allergic rhinitis: Secondary | ICD-10-CM | POA: Diagnosis not present

## 2021-05-17 DIAGNOSIS — L239 Allergic contact dermatitis, unspecified cause: Secondary | ICD-10-CM | POA: Diagnosis not present

## 2021-05-17 NOTE — Progress Notes (Signed)
? ?FOLLOW UP ? ?Date of Service/Encounter:  05/17/21 ? ? ?Assessment:  ? ?Mild persistent asthma without complication - doing remarkably well on Dupixent ?  ?Idiopathic urticaria - stable on Dupixent to help with coexisting atopic dermatitis ?  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  ?Perennial allergic rhinitis - now doing well off of allergen iummunotherapy ?  ?Normal autoimmune work up (ANA, inflammatory markers) in the past ? ?Plan/Recommendations:  ? ?1. Mild persistent asthma, uncomplicated ?- Lung function looks AWESOME today.  ?- I think you have it together with Dupixent.   ?- Daily controller medication(s): Dupixent every 14 days ?- Rescue medications: ProAir 4 puffs every 4-6 hours as needed ?- Changes during respiratory infections or worsening symptoms: add on Qvar 65mcg to 2 puffs twice daily for TWO WEEKS. ?- Asthma control goals:  ?* Full participation in all desired activities (may need albuterol before activity) ?* Albuterol use two time or less a week on average (not counting use with activity) ?* Cough interfering with sleep two time or less a month ?* Oral steroids no more than once a year ?* No hospitalizations ? ?2. Gastroesophageal reflux disease ?- Continue with Nexium daily.  ? ?3. Allergic rhinoconjunctivitis (horse, trees, indoor molds, outdoor molds, cat and dog) ?- Continue with: Rhincort 1-2 sprays daily AS NEEDED, and Zyrtec as needed, and Astelin (azelastine) 2 sprays per nostril 1-2 times daily AS NEEDED ?- You can use an extra dose of the antihistamine, if needed, for breakthrough symptoms.  ?- Consider nasal saline rinses 1-2 times daily to remove allergens from the nasal cavities as well as help with mucous clearance (this is especially helpful to do before the nasal sprays are given) ? ?4. Eczema with overlying contact dermatitis  ?- Stop  the Elidel and Eucrisa. ?- Start Opzelura twice daily as needed (safe to use over the entire body including the face and hands).  ?- Continue with your cetirizine/famotidine cocktail (you can certainly try stopping these and see how you do).  ? ?5. Follow up in 6 months or earlier if needed. ? ?Subjective:  ? ?Ariana Green is a 56 y.o. female presenting today for follow up of  ?Chief Complaint  ?Patient presents with  ? Asthma  ?  6 mth f/u - good  ? ? ?Ariana Green has a history of the following: ?Patient Active Problem List  ? Diagnosis Date Noted  ? Allergic contact dermatitis 08/02/2019  ? Gastroesophageal reflux disease 01/22/2017  ? Seasonal and perennial allergic rhinitis 01/22/2017  ? Allergic rhinoconjunctivitis 11/19/2014  ? Asthma 11/19/2014  ? ? ?History obtained from: chart review and patient. ? ?Jaymarie is a 56 y.o. female presenting for a follow up visit.  She was last seen in our ?2022.  At that time, her lung testing looked excellent.  We stopped her Singulair and continued with Dupixent every 14 days as well as albuterol as needed.  Reflux was controlled with Nexium daily.  Allergic rhinitis was controlled with Rhinocort 1 to 2 sprays daily as needed as well as Zyrtec and Astelin.  For her eczema, she was started on Opzelura twice daily as needed. ? ?Since last visit, she has done very well. ? ?Asthma/Respiratory Symptom History: The Dupixent is controlling her asthma very well. Occasionally she needs to use her rescue. She will add an extra Zytrec on board to help her deal with her symptoms in the morning.  She did get rid of the Singulair.  She does not use a routine everyday medication for her symptoms.  She has not been to the emergency room nor has she needed any prednisone or urgent care visits. ? ?Allergic Rhinitis Symptom History: Her allergic rhinitis is under good control with Rhinocort as needed and Zyrtec.  She sometimes uses the Astelin but it is not her favorite.  She has not  needed any antibiotics at all. ? ?Skin Symptom History: She is using the Opzeular nearly every day. She does rotate with Elidel. She also has the TAC to use when it is particularly bad. She is looking for SLS free items. She uses an app called SkinSafe.  This allows her to figure out what cosmetics and other items that she can safely use and not use. ? ?She has two sons - ages 80 and 62 - who are gainfully employed and have moved out of the house.  1 is working for [employer] some type of Energy manager. ? ?Otherwise, there have been no changes to her past medical history, surgical history, family history, or social history. ? ? ? ?Review of Systems  ?Constitutional: Negative.  Negative for chills, fever, malaise/fatigue and weight loss.  ?HENT:  Negative for congestion, ear discharge, ear pain and sinus pain.   ?Eyes:  Negative for pain, discharge and redness.  ?Respiratory:  Negative for cough, sputum production, shortness of breath, wheezing and stridor.   ?Cardiovascular: Negative.  Negative for chest pain and palpitations.  ?Gastrointestinal:  Negative for abdominal pain, constipation, diarrhea, heartburn, nausea and vomiting.  ?Skin: Negative.  Negative for itching and rash.  ?Neurological:  Negative for dizziness and headaches.  ?Endo/Heme/Allergies:  Positive for environmental allergies. Does not bruise/bleed easily.   ? ? ? ?Objective:  ? ?Blood pressure 108/68, pulse 85, temperature (!) 96 ?F (35.6 ?C), resp. rate 16, height 5\' 1"  (1.549 m), weight 143 lb 3.2 oz (65 kg), SpO2 97 %. ?Body mass index is 27.06 kg/m?. ? ? ? ?Physical Exam ?Vitals reviewed.  ?Constitutional:   ?   Appearance: Normal appearance. She is well-developed.  ?   Comments: Very pleasant and talkative.  ?HENT:  ?   Head: Normocephalic and atraumatic.  ?   Right Ear: Tympanic membrane, ear canal and external ear normal.  ?   Left Ear: Tympanic membrane, ear canal and external ear normal.  ?   Nose: No nasal deformity,  septal deviation, mucosal edema or rhinorrhea.  ?   Right Turbinates: Enlarged, swollen and pale.  ?   Left Turbinates: Enlarged, swollen and pale.  ?   Right Sinus: No maxillary sinus tenderness or frontal sinus tenderness.  ?   Left Sinus: No maxillary sinus tenderness or frontal sinus tenderness.  ?   Mouth/Throat:  ?   Mouth: Mucous membranes are not pale and not dry.  ?   Pharynx: Uvula midline.  ?Eyes:  ?   General: Lids are normal. No allergic shiner.    ?   Right eye: No discharge.     ?   Left eye: No discharge.  ?   Conjunctiva/sclera: Conjunctivae normal.  ?   Right eye: Right conjunctiva is not injected. No chemosis. ?   Left eye: Left conjunctiva is not injected. No chemosis. ?   Pupils: Pupils are equal, round, and reactive to light.  ?Cardiovascular:  ?   Rate and Rhythm: Normal rate and regular rhythm.  ?   Heart sounds: Normal heart sounds.  ?  Pulmonary:  ?   Effort: Pulmonary effort is normal. No tachypnea, accessory muscle usage or respiratory distress.  ?   Breath sounds: Normal breath sounds. No wheezing, rhonchi or rales.  ?Chest:  ?   Chest wall: No tenderness.  ?Lymphadenopathy:  ?   Cervical: No cervical adenopathy.  ?Skin: ?   General: Skin is warm.  ?   Capillary Refill: Capillary refill takes less than 2 seconds.  ?   Coloration: Skin is not pale.  ?   Findings: No abrasion, erythema, petechiae or rash. Rash is not papular, urticarial or vesicular.  ?   Comments: Her skin looks quite good.  I have never seen it looked this good.  ?Neurological:  ?   Mental Status: She is alert.  ?Psychiatric:     ?   Behavior: Behavior is cooperative.  ?  ? ?Diagnostic studies:   ? ?Spirometry: results normal (FEV1: 2.26/95%, FVC: 3.04/102%, FEV1/FVC: 74%).  ?  ?Spirometry consistent with normal pattern.  ? ? ?Allergy Studies: none ? ? ? ? ? ?  ?Salvatore Marvel, MD  ?[facility] ? ? ? ? ? ? ?

## 2021-05-17 NOTE — Patient Instructions (Addendum)
1. Mild persistent asthma, uncomplicated ?- Lung function looks AWESOME today.  ?- I think you have it together with Dupixent and albuterol as needed.  ?- Daily controller medication(s): Dupixent every 14 days ?- Rescue medications: ProAir 4 puffs every 4-6 hours as needed ?- Changes during respiratory infections or worsening symptoms: add on Qvar to 2 puffs twice daily for TWO WEEKS. ?- Asthma control goals:  ?* Full participation in all desired activities (may need albuterol before activity) ?* Albuterol use two time or less a week on average (not counting use with activity) ?* Cough interfering with sleep two time or less a month ?* Oral steroids no more than once a year ?* No hospitalizations ? ?2. Gastroesophageal reflux disease ?- Continue with Nexium daily.  ? ?3. Allergic rhinoconjunctivitis (horse, trees, indoor molds, outdoor molds, cat and dog) - s/p allergen immunotherapy ?- Continue with: Rhincort 1-2 sprays daily AS NEEDED and Zyrtec as needed ?- You can use an extra dose of the antihistamine, if needed, for breakthrough symptoms.  ?- Consider nasal saline rinses 1-2 times daily to remove allergens from the nasal cavities as well as help with mucous clearance (this is especially helpful to do before the nasal sprays are given) ? ?4. Eczema with overlying contact dermatitis  ?- Continue with alternating Elidel and Opzelura twice daily as needed (safe to use over the entire body including the face and hands).  ? ?5. Return in about 6 months (around 11/17/2021).  ? ? ?Please inform us of any Emergency Department visits, hospitalizations, or changes in symptoms. Call us before going to the ED for breathing or allergy symptoms since we might be able to fit you in for a sick visit. Feel free to contact us anytime with any questions, problems, or concerns. ? ?It was a pleasure to see you again today! ? ?Websites that have reliable patient information: ?1. American Academy of Asthma, Allergy, and  Immunology: www.aaaai.org ?2. Food Allergy Research and Education (FARE): foodallergy.org ?3. Mothers of Asthmatics: http://www.asthmacommunitynetwork.org ?4. [employer] of Allergy, Asthma, and Immunology: MissingWeapons.ca ? ? ?COVID-19 Vaccine Information can be found at: PodExchange.nl For questions related to vaccine distribution or appointments, please email [email or web address] or call [phone].  ? ? ? ??Like? [place] on Facebook and Instagram for our latest updates!  ?  ? ? ? ?Make sure you are registered to vote! If you have moved or changed any of your contact information, you will need to get this updated before voting! ? ?In some cases, you MAY be able to register to vote online: [email or web address] ? ? ? ? ? ? ?

## 2021-05-21 ENCOUNTER — Encounter: Payer: Self-pay | Admitting: Allergy & Immunology

## 2021-05-28 DIAGNOSIS — M79642 Pain in left hand: Secondary | ICD-10-CM | POA: Diagnosis not present

## 2021-05-28 DIAGNOSIS — M13849 Other specified arthritis, unspecified hand: Secondary | ICD-10-CM | POA: Diagnosis not present

## 2021-05-28 DIAGNOSIS — R2232 Localized swelling, mass and lump, left upper limb: Secondary | ICD-10-CM | POA: Diagnosis not present

## 2021-07-14 ENCOUNTER — Other Ambulatory Visit: Payer: Self-pay | Admitting: Allergy & Immunology

## 2021-07-17 DIAGNOSIS — Z1231 Encounter for screening mammogram for malignant neoplasm of breast: Secondary | ICD-10-CM | POA: Diagnosis not present

## 2021-07-25 ENCOUNTER — Other Ambulatory Visit: Payer: Self-pay | Admitting: Obstetrics and Gynecology

## 2021-07-25 DIAGNOSIS — Z803 Family history of malignant neoplasm of breast: Secondary | ICD-10-CM

## 2021-08-13 DIAGNOSIS — R2232 Localized swelling, mass and lump, left upper limb: Secondary | ICD-10-CM | POA: Diagnosis not present

## 2021-08-13 HISTORY — PX: HAND SURGERY: SHX662

## 2021-10-16 DIAGNOSIS — K625 Hemorrhage of anus and rectum: Secondary | ICD-10-CM | POA: Diagnosis not present

## 2021-11-08 DIAGNOSIS — L2389 Allergic contact dermatitis due to other agents: Secondary | ICD-10-CM | POA: Diagnosis not present

## 2021-11-08 DIAGNOSIS — L700 Acne vulgaris: Secondary | ICD-10-CM | POA: Diagnosis not present

## 2021-11-08 DIAGNOSIS — D235 Other benign neoplasm of skin of trunk: Secondary | ICD-10-CM | POA: Diagnosis not present

## 2021-11-08 DIAGNOSIS — L710 Perioral dermatitis: Secondary | ICD-10-CM | POA: Diagnosis not present

## 2021-11-20 ENCOUNTER — Ambulatory Visit: Payer: BC Managed Care – PPO | Admitting: Allergy & Immunology

## 2021-11-20 DIAGNOSIS — K219 Gastro-esophageal reflux disease without esophagitis: Secondary | ICD-10-CM | POA: Diagnosis not present

## 2021-11-20 DIAGNOSIS — Z23 Encounter for immunization: Secondary | ICD-10-CM | POA: Diagnosis not present

## 2021-11-20 DIAGNOSIS — E559 Vitamin D deficiency, unspecified: Secondary | ICD-10-CM | POA: Diagnosis not present

## 2021-11-20 DIAGNOSIS — Z79899 Other long term (current) drug therapy: Secondary | ICD-10-CM | POA: Diagnosis not present

## 2021-11-20 DIAGNOSIS — J45909 Unspecified asthma, uncomplicated: Secondary | ICD-10-CM | POA: Diagnosis not present

## 2021-11-20 DIAGNOSIS — E2839 Other primary ovarian failure: Secondary | ICD-10-CM | POA: Diagnosis not present

## 2021-11-20 DIAGNOSIS — Z0001 Encounter for general adult medical examination with abnormal findings: Secondary | ICD-10-CM | POA: Diagnosis not present

## 2021-11-20 DIAGNOSIS — L309 Dermatitis, unspecified: Secondary | ICD-10-CM | POA: Diagnosis not present

## 2021-11-20 DIAGNOSIS — E7800 Pure hypercholesterolemia, unspecified: Secondary | ICD-10-CM | POA: Diagnosis not present

## 2021-11-27 ENCOUNTER — Other Ambulatory Visit: Payer: Self-pay | Admitting: *Deleted

## 2021-11-27 ENCOUNTER — Encounter: Payer: Self-pay | Admitting: Allergy & Immunology

## 2021-11-27 ENCOUNTER — Ambulatory Visit (INDEPENDENT_AMBULATORY_CARE_PROVIDER_SITE_OTHER): Payer: BC Managed Care – PPO | Admitting: Allergy & Immunology

## 2021-11-27 VITALS — BP 110/64 | HR 60 | Temp 97.3°F | Resp 16 | Ht 61.0 in | Wt 141.0 lb

## 2021-11-27 DIAGNOSIS — L2089 Other atopic dermatitis: Secondary | ICD-10-CM

## 2021-11-27 DIAGNOSIS — J3089 Other allergic rhinitis: Secondary | ICD-10-CM | POA: Diagnosis not present

## 2021-11-27 DIAGNOSIS — J4530 Mild persistent asthma, uncomplicated: Secondary | ICD-10-CM | POA: Diagnosis not present

## 2021-11-27 DIAGNOSIS — L239 Allergic contact dermatitis, unspecified cause: Secondary | ICD-10-CM | POA: Diagnosis not present

## 2021-11-27 MED ORDER — OPZELURA 1.5 % EX CREA: 1.0000 "application " | TOPICAL_CREAM | Freq: Two times a day (BID) | CUTANEOUS | 1 refills | Status: DC

## 2021-11-27 MED ORDER — ALBUTEROL SULFATE HFA 108 (90 BASE) MCG/ACT IN AERS: 2.0000 | INHALATION_SPRAY | Freq: Four times a day (QID) | RESPIRATORY_TRACT | 2 refills | Status: AC | PRN

## 2021-11-27 MED ORDER — OPZELURA 1.5 % EX CREA: 1.0000 "application " | TOPICAL_CREAM | Freq: Two times a day (BID) | CUTANEOUS | 2 refills | Status: DC

## 2021-11-27 MED ORDER — IPRATROPIUM BROMIDE 0.06 % NA SOLN: 2.0000 | Freq: Three times a day (TID) | NASAL | 5 refills | Status: AC

## 2021-11-27 NOTE — Progress Notes (Signed)
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 Mild persistent asthma, uncomplicated - Lung function looks AWESOME today.  - I think you have it together with Dupixent and albuterol as needed.  - Daily controller medication(s): Dupixent every 14 days - Rescue medications: ProAir 4 puffs every 4-6 hours as needed - Changes during respiratory infections or worsening symptoms: add on Qvar 78mcg to 2 puffs twice daily for TWO WEEKS. - Asthma control goals:  * Full participation in all desired activities (may need albuterol before activity) * Albuterol use two time or less a week on average (not counting use with activity) * Cough interfering with sleep two time or less a month * Oral steroids no more than once a year * No hospitalizations  2. Gastroesophageal reflux disease - Continue with Nexium daily.   3. Allergic rhinoconjunctivitis (horse, trees, indoor molds, outdoor molds, cat and dog) - s/p allergen immunotherapy - Continue with: Rhincort 1-2 sprays daily AS NEEDED and Zyrtec as needed - Start taking: Atrovent one spray per nostril every 8 hours as needed (this can be OVER drying) - You can use an extra dose of the antihistamine, if needed, for breakthrough symptoms.  - Consider nasal saline rinses 1-2 times daily to remove allergens from the nasal cavities as well as help with mucous clearance (this is especially helpful  to do before the nasal sprays are given) - Overall I think you are doing well.   4. Eczema with overlying contact dermatitis  - Continue with alternating Elidel and Opzelura twice daily as needed (safe to use over the entire body including the face and hands).   5. Return in about 6 months (around 05/28/2022).    Subjective:   Ariana Green is a 56 y.o. female presenting today for follow up of  Chief Complaint  Patient presents with   Follow-up    Lately been having problems with her allergies but she thinks it is due to the change of the seasons. No flare ups with Asthma.    Ariana Green has a history of the following: Patient Active Problem List   Diagnosis Date Noted   Allergic contact dermatitis 08/02/2019   Gastroesophageal reflux disease 01/22/2017   Seasonal and perennial allergic rhinitis 01/22/2017   Allergic rhinoconjunctivitis 11/19/2014   Asthma 11/19/2014    History obtained from: chart review and patient.  Ariana Green is a 56 y.o. female presenting for a follow up visit.  She was last seen in March 2023.  At that time, her lung function looked amazing.  We continue with Dupixent every 14 days and albuterol as needed.  She has Qvar that she has during respiratory flares.  For her reflux, we continue with Nexium.  For her allergic rhinitis, we continue with Rhinocort as well as Astelin as needed. For her eczema contact dermatitis, we stopped Elidel and Eucrisa and started Opzelura twice daily.  She continue with her cetirizine and famotidine cocktail.  Since the last visit, she has largely done well.  Asthma/Respiratory Symptom History: She is having a lot of SOB right now. She thinks that this is allergy mediated. She is not using her rescue inhaler and she has not had needed her Qvar.  She is due for a Dupixent shot tomorrow. She does think that her Dupixent is well covered by her insurance. She was told that her "specialty med might change pharmacies.   Allergic  Rhinitis Symptom History: She did do some walking  this morning and this made it worse. Transitions to the fall are the worst for her. It is hard to tell how she is doing allergy wise compared to BEFORE her allergy shots stopped. She stopped the shots because she was having a lot of skin manifestations. Currently she is using a Pepcid BID as well as two cetirizine at night and one in the morning. She did restart her montelukast. She felt that her rhinorrhea had changed and she Netti potted and started her zinc. She then started the montelukast. She did start her Rhinocort and this has helped.   She is slowly and surely starting to tolerate exposures. She feels that she is getting better over time.    Skin Symptom History: She is on Opzeulra which is working very well for her. She switches between Elidel and Opzelura.  Elidel works better in certain parts of her body and Opzelura works better than the other. Overall her skin is very stable. She thinks that she is allergic to SLS and she changed back.   She has arthritis and takes Celebrex.  She also had some debridement of her finger joints. This was in June 2023. She has not been able to play tennis since that time.   Otherwise, there have been no changes to her past medical history, surgical history, family history, or social history.    Review of Systems  Constitutional: Negative.  Negative for chills, fever, malaise/fatigue and weight loss.  HENT:  Negative for congestion, ear discharge, ear pain and sinus pain.   Eyes:  Negative for pain, discharge and redness.  Respiratory:  Negative for cough, sputum production, shortness of breath, wheezing and stridor.   Cardiovascular: Negative.  Negative for chest pain and palpitations.  Gastrointestinal:  Negative for abdominal pain, constipation, diarrhea, heartburn, nausea and vomiting.  Skin: Negative.  Negative for itching and rash.  Neurological:  Negative for dizziness and headaches.   Endo/Heme/Allergies:  Positive for environmental allergies. Does not bruise/bleed easily.       Objective:   Blood pressure 110/64, pulse 60, temperature (!) 97.3 F (36.3 C), temperature source Temporal, resp. rate 16, height 5\' 1"  (1.549 m), weight 141 lb (64 kg), SpO2 97 %. Body mass index is 26.64 kg/m.    Physical Exam Vitals reviewed.  Constitutional:      Appearance: Normal appearance. She is well-developed.     Comments: Very pleasant and talkative.  HENT:     Head: Normocephalic and atraumatic.     Right Ear: Tympanic membrane, ear canal and external ear normal.     Left Ear: Tympanic membrane, ear canal and external ear normal.     Nose: No nasal deformity, septal deviation, mucosal edema or rhinorrhea.     Right Turbinates: Enlarged, swollen and pale.     Left Turbinates: Enlarged, swollen and pale.     Right Sinus: No maxillary sinus tenderness or frontal sinus tenderness.     Left Sinus: No maxillary sinus tenderness or frontal sinus tenderness.     Mouth/Throat:  Mouth: Mucous membranes are not pale and not dry.     Pharynx: Uvula midline.  Eyes:     General: Lids are normal. No allergic shiner.       Right eye: No discharge.        Left eye: No discharge.     Conjunctiva/sclera: Conjunctivae normal.     Right eye: Right conjunctiva is not injected. No chemosis.    Left eye: Left conjunctiva is not injected. No chemosis.    Pupils: Pupils are equal, round, and reactive to light.  Cardiovascular:     Rate and Rhythm: Normal rate and regular rhythm.     Heart sounds: Normal heart sounds.  Pulmonary:     Effort: Pulmonary effort is normal. No tachypnea, accessory muscle usage or respiratory distress.     Breath sounds: Normal breath sounds. No wheezing, rhonchi or rales.  Chest:     Chest wall: No tenderness.  Lymphadenopathy:     Cervical: No cervical adenopathy.  Skin:    General: Skin is warm.     Capillary Refill: Capillary refill takes less than  2 seconds.     Coloration: Skin is not pale.     Findings: No abrasion, erythema, petechiae or rash. Rash is not papular, urticarial or vesicular.     Comments: Her skin looks quite good.  I have never seen it looked this good.  Neurological:     Mental Status: She is alert.  Psychiatric:        Behavior: Behavior is cooperative.      Diagnostic studies:   Spirometry: results normal (FEV1: 2.25/95%, FVC: 2.98/100%, FEV1/FVC: 76%).    Spirometry consistent with normal pattern.   Allergy Studies: none       Malachi Bonds, MD  [facility]

## 2021-11-27 NOTE — Patient Instructions (Addendum)
1. Mild persistent asthma, uncomplicated - Lung function looks AWESOME today.  - I think you have it together with Dupixent and albuterol as needed.  - Daily controller medication(s): Dupixent every 14 days - Rescue medications: ProAir 4 puffs every 4-6 hours as needed - Changes during respiratory infections or worsening symptoms: add on Qvar 75mcg to 2 puffs twice daily for TWO WEEKS. - Asthma control goals:  * Full participation in all desired activities (may need albuterol before activity) * Albuterol use two time or less a week on average (not counting use with activity) * Cough interfering with sleep two time or less a month * Oral steroids no more than once a year * No hospitalizations  2. Gastroesophageal reflux disease - Continue with Nexium daily.   3. Allergic rhinoconjunctivitis (horse, trees, indoor molds, outdoor molds, cat and dog) - s/p allergen immunotherapy - Continue with: Rhincort 1-2 sprays daily AS NEEDED and Zyrtec as needed - Start taking: Atrovent one spray per nostril every 8 hours as needed (this can be OVER drying) - You can use an extra dose of the antihistamine, if needed, for breakthrough symptoms.  - Consider nasal saline rinses 1-2 times daily to remove allergens from the nasal cavities as well as help with mucous clearance (this is especially helpful to do before the nasal sprays are given) - Overall I think you are doing well.   4. Eczema with overlying contact dermatitis  - Continue with alternating Elidel and Opzelura twice daily as needed (safe to use over the entire body including the face and hands).   5. Return in about 6 months (around 05/28/2022).    Please inform us of any Emergency Department visits, hospitalizations, or changes in symptoms. Call us before going to the ED for breathing or allergy symptoms since we might be able to fit you in for a sick visit. Feel free to contact us anytime with any questions, problems, or concerns.  It was a  pleasure to see you again today!  Websites that have reliable patient information: 1. American Academy of Asthma, Allergy, and Immunology: www.aaaai.org 2. Food Allergy Research and Education (FARE): foodallergy.org 3. Mothers of Asthmatics: http://www.asthmacommunitynetwork.org 4. American College of Allergy, Asthma, and Immunology: www.acaai.org   COVID-19 Vaccine Information can be found at: [email or web address] For questions related to vaccine distribution or appointments, please email [email or web address] or call [phone].     "Like" [place] on Facebook and Instagram for our latest updates!       Make sure you are registered to vote! If you have moved or changed any of your contact information, you will need to get this updated before voting!  In some cases, you MAY be able to register to vote online: [email or web address]

## 2021-12-05 ENCOUNTER — Telehealth: Payer: Self-pay | Admitting: *Deleted

## 2021-12-05 ENCOUNTER — Other Ambulatory Visit (HOSPITAL_COMMUNITY): Payer: Self-pay

## 2021-12-05 MED ORDER — DUPIXENT 300 MG/2ML ~~LOC~~ SOSY
300.0000 mg | PREFILLED_SYRINGE | SUBCUTANEOUS | 11 refills | Status: DC
  Filled 2021-12-05 – 2021-12-24 (×2): qty 4, 28d supply, fill #0

## 2021-12-05 NOTE — Telephone Encounter (Signed)
Advised Dupixent Rx change from [place] due to no longer dispensing biologics to [employer]. Attached copay card info and pt advised jsut received shipment 9/26

## 2021-12-06 ENCOUNTER — Other Ambulatory Visit (HOSPITAL_COMMUNITY): Payer: Self-pay

## 2021-12-19 ENCOUNTER — Ambulatory Visit
Admission: RE | Admit: 2021-12-19 | Discharge: 2021-12-19 | Disposition: A | Discharge status: Home / Self Care | Payer: BLUE CROSS/BLUE SHIELD | Source: Ambulatory Visit | Attending: Obstetrics and Gynecology | Admitting: Obstetrics and Gynecology

## 2021-12-19 DIAGNOSIS — Z803 Family history of malignant neoplasm of breast: Secondary | ICD-10-CM | POA: Diagnosis not present

## 2021-12-19 DIAGNOSIS — Z1239 Encounter for other screening for malignant neoplasm of breast: Secondary | ICD-10-CM | POA: Diagnosis not present

## 2021-12-19 MED ORDER — GADOBUTROL 1 MMOL/ML IV SOLN
7.0000 mL | Freq: Once | INTRAVENOUS | Status: AC | PRN
  Administered 2021-12-19: 7 mL via INTRAVENOUS

## 2021-12-21 ENCOUNTER — Other Ambulatory Visit (HOSPITAL_COMMUNITY): Payer: Self-pay

## 2021-12-24 ENCOUNTER — Other Ambulatory Visit (HOSPITAL_COMMUNITY): Payer: Self-pay

## 2021-12-26 ENCOUNTER — Other Ambulatory Visit (HOSPITAL_COMMUNITY): Payer: Self-pay

## 2022-01-01 ENCOUNTER — Other Ambulatory Visit (HOSPITAL_COMMUNITY): Payer: Self-pay

## 2022-01-02 DIAGNOSIS — K648 Other hemorrhoids: Secondary | ICD-10-CM | POA: Diagnosis not present

## 2022-01-02 DIAGNOSIS — K5730 Diverticulosis of large intestine without perforation or abscess without bleeding: Secondary | ICD-10-CM | POA: Diagnosis not present

## 2022-01-02 DIAGNOSIS — K635 Polyp of colon: Secondary | ICD-10-CM | POA: Diagnosis not present

## 2022-01-02 DIAGNOSIS — K625 Hemorrhage of anus and rectum: Secondary | ICD-10-CM | POA: Diagnosis not present

## 2022-01-02 DIAGNOSIS — Z86010 Personal history of colonic polyps: Secondary | ICD-10-CM | POA: Diagnosis not present

## 2022-01-17 ENCOUNTER — Other Ambulatory Visit (HOSPITAL_COMMUNITY): Payer: Self-pay

## 2022-01-22 ENCOUNTER — Telehealth: Payer: Self-pay

## 2022-01-22 NOTE — Telephone Encounter (Signed)
PA request received through [facility] from [facility] for Opzelura 1.5% cream.  PA has been submitted and is awaiting determination.   Key: [id number]

## 2022-01-24 NOTE — Telephone Encounter (Signed)
Pa was denied please advise to change in therapy

## 2022-01-24 NOTE — Telephone Encounter (Signed)
PA for Opzelura 1.5% cream has been DENIED. No denial letter available at this time.

## 2022-01-25 NOTE — Telephone Encounter (Signed)
Can we contact the company to check on alternatives?  I thought the co-pay card was supposed to work no matter what.  Malachi Bonds, MD [facility]

## 2022-01-25 NOTE — Telephone Encounter (Signed)
Will work on finding the reps number and reaching out to them about it

## 2022-01-28 ENCOUNTER — Other Ambulatory Visit (HOSPITAL_COMMUNITY): Payer: Self-pay

## 2022-01-28 ENCOUNTER — Telehealth: Payer: Self-pay | Admitting: *Deleted

## 2022-01-28 MED ORDER — DUPIXENT 300 MG/2ML ~~LOC~~ SOSY
300.0000 mg | PREFILLED_SYRINGE | SUBCUTANEOUS | 11 refills | Status: DC
  Filled 2022-01-28 – 2022-01-30 (×2): qty 4, 28d supply, fill #0
  Filled 2022-02-22: qty 4, 28d supply, fill #1
  Filled 2022-03-25: qty 4, 28d supply, fill #2
  Filled 2022-04-12 (×2): qty 4, 28d supply, fill #3
  Filled 2022-05-20: qty 4, 28d supply, fill #4
  Filled 2022-06-18: qty 4, 28d supply, fill #5
  Filled 2022-07-15: qty 4, 28d supply, fill #6
  Filled 2022-08-08: qty 4, 28d supply, fill #7
  Filled 2022-09-05: qty 4, 28d supply, fill #8
  Filled 2022-10-03: qty 4, 28d supply, fill #9
  Filled 2022-10-31: qty 4, 28d supply, fill #10
  Filled 2022-11-27 (×2): qty 4, 28d supply, fill #11

## 2022-01-28 NOTE — Telephone Encounter (Signed)
Who is our rep Im unable to find a card at [employer] or [place]

## 2022-01-28 NOTE — Telephone Encounter (Signed)
Change from [facility] to [place] per [employer]

## 2022-01-29 ENCOUNTER — Other Ambulatory Visit (HOSPITAL_COMMUNITY): Payer: Self-pay

## 2022-01-29 NOTE — Telephone Encounter (Signed)
I think her name is [name]. I am routing in [place] and [place] to confirm this.   Malachi Bonds, MD [facility]

## 2022-01-30 ENCOUNTER — Other Ambulatory Visit (HOSPITAL_COMMUNITY): Payer: Self-pay

## 2022-01-30 NOTE — Telephone Encounter (Signed)
Ariana Green is currently on maternity leave. We do not have card for the back up opzelura rep.

## 2022-02-01 ENCOUNTER — Other Ambulatory Visit (HOSPITAL_COMMUNITY): Payer: Self-pay

## 2022-02-04 NOTE — Telephone Encounter (Signed)
I wonder if we can call someone else with Opzelura? Remind me work on this tomorrow.   Malachi Bonds, MD [facility]

## 2022-02-06 NOTE — Telephone Encounter (Signed)
OK here is the Rep for Opzeulra that is covering while [name] is out on maternity leave:     Joaquin Music  [phone] [email or web address]  Can someone call to see what might be going on or if we need to send the Opzeulra to a particular pharmacy?  Malachi Bonds, MD [facility]

## 2022-02-06 NOTE — Telephone Encounter (Signed)
PA has been resubmitted through CoverMyMeds for Opzelura and is currently pending approval/denial.

## 2022-02-08 NOTE — Telephone Encounter (Signed)
Pa was approved until 02/05/2023 pharmacy notified

## 2022-02-22 ENCOUNTER — Other Ambulatory Visit: Payer: Self-pay

## 2022-02-27 ENCOUNTER — Other Ambulatory Visit: Payer: Self-pay

## 2022-03-18 DIAGNOSIS — Z1151 Encounter for screening for human papillomavirus (HPV): Secondary | ICD-10-CM | POA: Diagnosis not present

## 2022-03-18 DIAGNOSIS — Z124 Encounter for screening for malignant neoplasm of cervix: Secondary | ICD-10-CM | POA: Diagnosis not present

## 2022-03-18 DIAGNOSIS — Z6826 Body mass index (BMI) 26.0-26.9, adult: Secondary | ICD-10-CM | POA: Diagnosis not present

## 2022-03-18 DIAGNOSIS — Z01419 Encounter for gynecological examination (general) (routine) without abnormal findings: Secondary | ICD-10-CM | POA: Diagnosis not present

## 2022-03-25 ENCOUNTER — Other Ambulatory Visit (HOSPITAL_COMMUNITY): Payer: Self-pay

## 2022-04-12 ENCOUNTER — Other Ambulatory Visit (HOSPITAL_COMMUNITY): Payer: Self-pay

## 2022-04-16 ENCOUNTER — Other Ambulatory Visit: Payer: Self-pay

## 2022-04-17 ENCOUNTER — Other Ambulatory Visit: Payer: Self-pay

## 2022-04-17 ENCOUNTER — Other Ambulatory Visit (HOSPITAL_COMMUNITY): Payer: Self-pay

## 2022-05-20 ENCOUNTER — Other Ambulatory Visit (HOSPITAL_COMMUNITY): Payer: Self-pay

## 2022-05-20 ENCOUNTER — Other Ambulatory Visit: Payer: Self-pay

## 2022-05-27 ENCOUNTER — Other Ambulatory Visit (HOSPITAL_COMMUNITY): Payer: Self-pay

## 2022-05-28 ENCOUNTER — Encounter: Payer: Self-pay | Admitting: Allergy & Immunology

## 2022-05-28 ENCOUNTER — Ambulatory Visit: Payer: BC Managed Care – PPO | Admitting: Allergy & Immunology

## 2022-05-28 ENCOUNTER — Other Ambulatory Visit: Payer: Self-pay

## 2022-05-28 VITALS — BP 112/70 | HR 66 | Temp 98.2°F | Resp 14 | Wt 142.3 lb

## 2022-05-28 DIAGNOSIS — J4530 Mild persistent asthma, uncomplicated: Secondary | ICD-10-CM

## 2022-05-28 DIAGNOSIS — L239 Allergic contact dermatitis, unspecified cause: Secondary | ICD-10-CM | POA: Diagnosis not present

## 2022-05-28 DIAGNOSIS — J302 Other seasonal allergic rhinitis: Secondary | ICD-10-CM

## 2022-05-28 DIAGNOSIS — L2089 Other atopic dermatitis: Secondary | ICD-10-CM

## 2022-05-28 DIAGNOSIS — J3089 Other allergic rhinitis: Secondary | ICD-10-CM | POA: Diagnosis not present

## 2022-05-28 MED ORDER — CLOBETASOL PROPIONATE 0.05 % EX CREA: TOPICAL_CREAM | Freq: Two times a day (BID) | CUTANEOUS | 5 refills | Status: AC | PRN

## 2022-05-28 MED ORDER — OPZELURA 1.5 % EX CREA: 1.0000 "application " | TOPICAL_CREAM | Freq: Two times a day (BID) | CUTANEOUS | 5 refills | Status: DC

## 2022-05-28 MED ORDER — ESOMEPRAZOLE MAGNESIUM 40 MG PO CPDR: 40.0000 mg | DELAYED_RELEASE_CAPSULE | Freq: Every day | ORAL | 1 refills | Status: DC

## 2022-05-28 MED ORDER — QVAR REDIHALER 80 MCG/ACT IN AERB: 2.0000 | INHALATION_SPRAY | Freq: Two times a day (BID) | RESPIRATORY_TRACT | 5 refills | Status: AC | PRN

## 2022-05-28 NOTE — Progress Notes (Signed)
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 Mild persistent asthma, uncomplicated - Lung function looks AWESOME today.  - I think you have it together with Dupixent and albuterol as needed.  - Daily controller medication(s): Dupixent every 14 days - Rescue medications: ProAir 4 puffs every 4-6 hours as needed - Changes during respiratory infections or worsening symptoms: add on Qvar 59mcg to 2 puffs twice daily for TWO WEEKS. - Asthma control goals:  * Full participation in all desired activities (may need albuterol before activity) * Albuterol use two time or less a week on average (not counting use with activity) * Cough interfering with sleep two time or less a month * Oral steroids no more than once a year * No hospitalizations  2. Gastroesophageal reflux disease - Continue with Nexium daily.   3. Allergic rhinoconjunctivitis (horse, trees, indoor molds, outdoor molds, cat and dog) - s/p allergen immunotherapy - Continue with: Rhincort 1-2 sprays daily AS NEEDED and Zyrtec as needed - You can use an extra dose of the antihistamine, if needed, for breakthrough symptoms.  - Consider nasal saline rinses 1-2 times daily to remove allergens from the nasal cavities as well as help with mucous clearance (this is especially helpful to do before the nasal sprays are given) - Overall I think you are doing well.   4. Eczema  with overlying contact dermatitis  - Continue with alternating Elidel and Opzelura twice daily as needed (safe to use over the entire body including the face and hands).   5. Return in about 6 months (around 11/28/2022).    Subjective:   Ariana Green is a 57 y.o. female presenting today for follow up of  Chief Complaint  Patient presents with   Asthma   Follow-up    Ariana Green has a history of the following: Patient Active Problem List   Diagnosis Date Noted   Allergic contact dermatitis 08/02/2019   Gastroesophageal reflux disease 01/22/2017   Seasonal and perennial allergic rhinitis 01/22/2017   Allergic rhinoconjunctivitis 11/19/2014   Asthma 11/19/2014    History obtained from: chart review and patient.  Ariana Green is a 57 y.o. female presenting for a follow up visit. She was last seen in September 2023.  At that time, we continue with Dupixent every 2 weeks.  We continued with albuterol as needed and Qvar added during flares.  For her GERD, she was doing well on Nexium.  For her allergies, we continue with Rhinocort 1 to 2 sprays per nostril daily as needed and Zyrtec as needed.  We started her on Atrovent 1 spray per nostril every 8 hours as needed.  Her eczema is under good control with alternating Elidel and Opzelura.  Since the last visit, she has done well.   Asthma/Respiratory Symptom History: She is still coughing, but this is not daily. She denies that this is throat clearing. She knows that this is more related to her asthma. She did have a dry cough today in clinic but  this is not her typical cough. She has the Qvar that she has not used since we saw her last time. She has not been on prednisone at all since the last visit. She is enjoying the [place]. This is still $0 out of pocket. She used to have a card that took care of the cost of it completely. Ariana Green used to just take care of it, but when they started a new program that includes a paper debit card that she  has to give to the pharmacy that supplies her [place]. She has had 3 different pharmacies in 3 months. She is rather confused by the whole process.   Allergic Rhinitis Symptom History: She feels that she has had some issues with ulcers in her nose since September. She felt that she had a "low level" sinus infection. This clears when she is on antibiotics for her face. She reports that this is on both side of her face. She is not having anything bothering her to the point where she has a true sinus infection. She does report that she has been having more congestion. This is more congestion in the winter months than other months. She is trying to get her Rhinocort back on most day. She has been using polysporin when it gets particularly bad.   She was having stomach pain from the montelukast.   She has been living with her sister for four months. There were renovations at her sister's home and they lived with Ariana Green until the construction was completed. Her sister was divorced for 3 years and she was living in [place] where they all grew up. But she had no other living relatives up there, so she decided to move down to be closer to [place].   Skin Symptom History: She continues to avoid SLS. She is using Skin Safe app to help with this. She has eczema on her face where she gets placed on a week of minocycline. This is happening more and more often.  She is avoiding as much as she can, but this process drives her nuts. Whenever she makes a product change, she never knows what sets her off. She reports that the winter has been difficult. Ariana Green are rough because she does not get along with sunscreen.  Her Dermatologist has never really felt that the Ariana Green was doing much for her skin, but she has continued with it since it was helping her breathing so much. She still sees Ariana Green. She thinks that a lot of this might be related to menopause. She no longer sees Ariana Green. Ariana Green has been  working better. She was on Elidel but this was $300. She has failed tacrolimus. She does not like the consistency of Eucrisa and it never absorbs into the skin.   GERD Symptom History: She remains on Nexium once daily.  This seems to be controlling her symptoms very effectively.  She does need refills of this.  She has arthritis and takes Celebrex.  She also had some debridement of her finger joints. This was in June 2023. She has not been able to play tennis since that time.   Her youngest son did get into dental school the second time around.  He is going to be starting at [employer] in the fall.  She is clearly very proud of him.  Her older son continues to work for a [employer] that works for [employer].  Otherwise, there have been no changes to her past medical history, surgical  history, family history, or social history.    Review of Systems  Constitutional: Negative.  Negative for chills, fever, malaise/fatigue and weight loss.  HENT:  Negative for congestion, ear discharge, ear pain and sinus pain.   Eyes:  Negative for pain, discharge and redness.  Respiratory:  Negative for cough, sputum production, shortness of breath, wheezing and stridor.   Cardiovascular: Negative.  Negative for chest pain and palpitations.  Gastrointestinal:  Negative for abdominal pain, constipation, diarrhea, heartburn, nausea and vomiting.  Skin:  Positive for rash. Negative for itching.  Neurological:  Negative for dizziness and headaches.  Endo/Heme/Allergies:  Positive for environmental allergies. Does not bruise/bleed easily.       Objective:   Blood pressure 112/70, pulse 66, temperature 98.2 F (36.8 C), temperature source Temporal, resp. rate 14, weight 142 lb 4.8 oz (64.5 kg), SpO2 99 %. Body mass index is 26.89 kg/m.    Physical Exam Vitals reviewed.  Constitutional:      Appearance: Normal appearance. She is well-developed.     Comments: Very pleasant and talkative.   HENT:     Head: Normocephalic and atraumatic.     Right Ear: Tympanic membrane, ear canal and external ear normal.     Left Ear: Tympanic membrane, ear canal and external ear normal.     Nose: No nasal deformity, septal deviation, mucosal edema or rhinorrhea.     Right Turbinates: Enlarged and swollen. Not pale.     Left Turbinates: Enlarged and swollen. Not pale.     Right Sinus: No maxillary sinus tenderness or frontal sinus tenderness.     Left Sinus: No maxillary sinus tenderness or frontal sinus tenderness.     Comments: No nasal lesions appreciated today. Turbinates are erythematous.     Mouth/Throat:     Mouth: Mucous membranes are not pale and not dry.     Pharynx: Uvula midline.  Eyes:     General: Lids are normal. No allergic shiner.       Right eye: No discharge.        Left eye: No discharge.     Conjunctiva/sclera: Conjunctivae normal.     Right eye: Right conjunctiva is not injected. No chemosis.    Left eye: Left conjunctiva is not injected. No chemosis.    Pupils: Pupils are equal, round, and reactive to light.  Cardiovascular:     Rate and Rhythm: Normal rate and regular rhythm.     Heart sounds: Normal heart sounds.  Pulmonary:     Effort: Pulmonary effort is normal. No tachypnea, accessory muscle usage or respiratory distress.     Breath sounds: Normal breath sounds. No wheezing, rhonchi or rales.  Chest:     Chest wall: No tenderness.  Lymphadenopathy:     Cervical: No cervical adenopathy.  Skin:    General: Skin is warm.     Capillary Refill: Capillary refill takes less than 2 seconds.     Coloration: Skin is not pale.     Findings: No abrasion, erythema, petechiae or rash. Rash is not papular, urticarial or vesicular.     Comments: Her skin looks quite good today.   Neurological:     Mental Status: She is alert.  Psychiatric:        Behavior: Behavior is cooperative.      Diagnostic studies:    Spirometry: results normal (FEV1: 2.64/112%, FVC:  3.63/123%, FEV1/FVC: 73%).    Spirometry consistent with normal pattern.    Allergy Studies: none  Salvatore Marvel, MD  [facility] [place]

## 2022-05-28 NOTE — Patient Instructions (Addendum)
1. Mild persistent asthma, uncomplicated - Lung function looks AWESOME today.  - I think you have it together with Dupixent and albuterol as needed.  - Daily controller medication(s): Dupixent every 14 days - Rescue medications: ProAir 4 puffs every 4-6 hours as needed - Changes during respiratory infections or worsening symptoms: add on Qvar 55mcg to 2 puffs twice daily for TWO WEEKS. - Asthma control goals:  * Full participation in all desired activities (may need albuterol before activity) * Albuterol use two time or less a week on average (not counting use with activity) * Cough interfering with sleep two time or less a month * Oral steroids no more than once a year * No hospitalizations  2. Gastroesophageal reflux disease - Continue with Nexium daily.   3. Allergic rhinoconjunctivitis (horse, trees, indoor molds, outdoor molds, cat and dog) - s/p allergen immunotherapy - Continue with: Rhincort 1-2 sprays daily AS NEEDED and Zyrtec as needed - You can use an extra dose of the antihistamine, if needed, for breakthrough symptoms.  - Consider nasal saline rinses 1-2 times daily to remove allergens from the nasal cavities as well as help with mucous clearance (this is especially helpful to do before the nasal sprays are given) - Overall I think you are doing well.   4. Eczema with overlying contact dermatitis  - Continue with alternating Elidel and Opzelura twice daily as needed (safe to use over the entire body including the face and hands).   5. Return in about 6 months (around 11/28/2022).    Please inform us of any Emergency Department visits, hospitalizations, or changes in symptoms. Call us before going to the ED for breathing or allergy symptoms since we might be able to fit you in for a sick visit. Feel free to contact us anytime with any questions, problems, or concerns.  It was a pleasure to see you again today!  Websites that have reliable patient information: 1. American  Academy of Asthma, Allergy, and Immunology: www.aaaai.org 2. Food Allergy Research and Education (FARE): foodallergy.org 3. Mothers of Asthmatics: http://www.asthmacommunitynetwork.org 4. American College of Allergy, Asthma, and Immunology: www.acaai.org   COVID-19 Vaccine Information can be found at: ShippingScam.co.uk For questions related to vaccine distribution or appointments, please email [email or web address] or call [phone].     "Like" [place] on Facebook and Instagram for our latest updates!       Make sure you are registered to vote! If you have moved or changed any of your contact information, you will need to get this updated before voting!  In some cases, you MAY be able to register to vote online: [email or web address]

## 2022-06-18 ENCOUNTER — Other Ambulatory Visit (HOSPITAL_COMMUNITY): Payer: Self-pay

## 2022-06-21 ENCOUNTER — Other Ambulatory Visit (HOSPITAL_COMMUNITY): Payer: Self-pay

## 2022-07-15 ENCOUNTER — Other Ambulatory Visit (HOSPITAL_COMMUNITY): Payer: Self-pay

## 2022-07-17 ENCOUNTER — Other Ambulatory Visit: Payer: Self-pay

## 2022-08-08 ENCOUNTER — Other Ambulatory Visit: Payer: Self-pay

## 2022-08-08 ENCOUNTER — Other Ambulatory Visit (HOSPITAL_COMMUNITY): Payer: Self-pay

## 2022-08-09 ENCOUNTER — Other Ambulatory Visit (HOSPITAL_COMMUNITY): Payer: Self-pay

## 2022-08-09 ENCOUNTER — Other Ambulatory Visit: Payer: Self-pay

## 2022-09-05 ENCOUNTER — Other Ambulatory Visit: Payer: Self-pay

## 2022-09-10 ENCOUNTER — Other Ambulatory Visit (HOSPITAL_COMMUNITY): Payer: Self-pay

## 2022-09-13 ENCOUNTER — Other Ambulatory Visit (HOSPITAL_COMMUNITY): Payer: Self-pay

## 2022-09-24 DIAGNOSIS — Z1231 Encounter for screening mammogram for malignant neoplasm of breast: Secondary | ICD-10-CM | POA: Diagnosis not present

## 2022-09-24 DIAGNOSIS — Z13820 Encounter for screening for osteoporosis: Secondary | ICD-10-CM | POA: Diagnosis not present

## 2022-09-27 ENCOUNTER — Other Ambulatory Visit: Payer: Self-pay | Admitting: Obstetrics and Gynecology

## 2022-09-27 DIAGNOSIS — Z803 Family history of malignant neoplasm of breast: Secondary | ICD-10-CM

## 2022-10-01 ENCOUNTER — Other Ambulatory Visit (HOSPITAL_COMMUNITY): Payer: Self-pay

## 2022-10-03 ENCOUNTER — Other Ambulatory Visit (HOSPITAL_COMMUNITY): Payer: Self-pay

## 2022-10-04 ENCOUNTER — Other Ambulatory Visit: Payer: Self-pay

## 2022-10-31 ENCOUNTER — Other Ambulatory Visit (HOSPITAL_COMMUNITY): Payer: Self-pay

## 2022-11-01 ENCOUNTER — Other Ambulatory Visit (HOSPITAL_COMMUNITY): Payer: Self-pay

## 2022-11-20 DIAGNOSIS — L309 Dermatitis, unspecified: Secondary | ICD-10-CM | POA: Diagnosis not present

## 2022-11-20 DIAGNOSIS — L578 Other skin changes due to chronic exposure to nonionizing radiation: Secondary | ICD-10-CM | POA: Diagnosis not present

## 2022-11-20 DIAGNOSIS — L710 Perioral dermatitis: Secondary | ICD-10-CM | POA: Diagnosis not present

## 2022-11-27 ENCOUNTER — Other Ambulatory Visit (HOSPITAL_COMMUNITY): Payer: Self-pay

## 2022-11-29 ENCOUNTER — Other Ambulatory Visit: Payer: Self-pay

## 2022-11-29 ENCOUNTER — Other Ambulatory Visit (HOSPITAL_COMMUNITY): Payer: Self-pay

## 2022-11-30 ENCOUNTER — Other Ambulatory Visit (HOSPITAL_COMMUNITY): Payer: Self-pay

## 2022-12-17 ENCOUNTER — Other Ambulatory Visit: Payer: Self-pay

## 2022-12-17 ENCOUNTER — Encounter: Payer: Self-pay | Admitting: Allergy & Immunology

## 2022-12-17 ENCOUNTER — Ambulatory Visit: Payer: BC Managed Care – PPO | Admitting: Allergy & Immunology

## 2022-12-17 VITALS — BP 120/78 | HR 63 | Temp 97.8°F | Resp 17

## 2022-12-17 DIAGNOSIS — L2089 Other atopic dermatitis: Secondary | ICD-10-CM

## 2022-12-17 DIAGNOSIS — L239 Allergic contact dermatitis, unspecified cause: Secondary | ICD-10-CM

## 2022-12-17 DIAGNOSIS — J3089 Other allergic rhinitis: Secondary | ICD-10-CM | POA: Diagnosis not present

## 2022-12-17 DIAGNOSIS — J4530 Mild persistent asthma, uncomplicated: Secondary | ICD-10-CM | POA: Diagnosis not present

## 2022-12-17 DIAGNOSIS — J302 Other seasonal allergic rhinitis: Secondary | ICD-10-CM

## 2022-12-17 MED ORDER — OPZELURA 1.5 % EX CREA: 1.0000 "application " | TOPICAL_CREAM | Freq: Two times a day (BID) | CUTANEOUS | 5 refills | Status: AC

## 2022-12-17 NOTE — Patient Instructions (Addendum)
1. Mild persistent asthma, uncomplicated - Lung function looks AWESOME today.  - I think you have it together with Dupixent and albuterol as needed.  - Daily controller medication(s): Dupixent every 14 days - Rescue medications: albuterol 4 puffs every 4-6 hours as needed - Changes during respiratory infections or worsening symptoms: add on Qvar to 2 puffs twice daily for TWO WEEKS. - Asthma control goals:  * Full participation in all desired activities (may need albuterol before activity) * Albuterol use two time or less a week on average (not counting use with activity) * Cough interfering with sleep two time or less a month * Oral steroids no more than once a year * No hospitalizations  2. Gastroesophageal reflux disease - Continue with Nexium daily.   3. Allergic rhinoconjunctivitis (horse, trees, indoor molds, outdoor molds, cat and dog) - s/p allergen immunotherapy - Continue with: Rhincort 1-2 sprays daily AS NEEDED and Zyrtec as needed - You can use an extra dose of the antihistamine, if needed, for breakthrough symptoms.  - Continue nasal saline rinses 1-2 times daily. - Continue with Bactroban daily as needed for the sores in your nose.   4. Eczema with overlying contact dermatitis  - Continue with alternating Elidel and Opzelura twice daily as needed (safe to use over the entire body including the face and hands).   5. Return in about 1 year (around 12/17/2023).    Please inform us of any Emergency Department visits, hospitalizations, or changes in symptoms. Call us before going to the ED for breathing or allergy symptoms since we might be able to fit you in for a sick visit. Feel free to contact us anytime with any questions, problems, or concerns.  It was a pleasure to see you again today!  Websites that have reliable patient information: 1. American Academy of Asthma, Allergy, and Immunology: www.aaaai.org 2. Food Allergy Research and Education (FARE):  foodallergy.org 3. Mothers of Asthmatics: http://www.asthmacommunitynetwork.org 4. American College of Allergy, Asthma, and Immunology: www.acaai.org   COVID-19 Vaccine Information can be found at: [email or web address] For questions related to vaccine distribution or appointments, please email [email or web address] or call [phone].     "Like" [place] on Facebook and Instagram for our latest updates!       Make sure you are registered to vote! If you have moved or changed any of your contact information, you will need to get this updated before voting!  In some cases, you MAY be able to register to vote online: [email or web address]

## 2022-12-17 NOTE — Progress Notes (Signed)
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 Mild persistent asthma, uncomplicated - Lung function looks AWESOME today.  - I think you have it together with Dupixent and albuterol as needed.  - Daily controller medication(s): Dupixent every 14 days - Rescue medications: albuterol 4 puffs every 4-6 hours as needed - Changes during respiratory infections or worsening symptoms: add on Qvar to 2 puffs twice daily for TWO WEEKS. - Asthma control goals:  * Full participation in all desired activities (may need albuterol before activity) * Albuterol use two time or less a week on average (not counting use with activity) * Cough interfering with sleep two time or less a month * Oral steroids no more than once a year * No hospitalizations  2. Gastroesophageal reflux disease - Continue with Nexium daily.   3. Allergic rhinoconjunctivitis (horse, trees, indoor molds, outdoor molds, cat and dog) - s/p allergen immunotherapy - Continue with: Rhincort 1-2 sprays daily AS NEEDED and Zyrtec as needed - You can use an extra dose of the antihistamine, if needed, for breakthrough symptoms.  - Continue nasal saline rinses 1-2 times daily. - Continue with Bactroban daily as needed for the sores in your nose.   4. Eczema with overlying contact dermatitis  - Continue with alternating Elidel and Opzelura twice daily as needed (safe to use  over the entire body including the face and hands).   5. Return in about 1 year (around 12/17/2023).    Ariana Green is a 57 y.o. female presenting today for follow up of  Chief Complaint  Patient presents with   Asthma   Eczema   Allergic Rhinitis     FAREN FLORENCE has a history of the following: Patient Active Problem List   Diagnosis Date Noted   Allergic contact dermatitis 08/02/2019   Gastroesophageal reflux disease 01/22/2017   Seasonal and perennial allergic rhinitis 01/22/2017   Allergic rhinoconjunctivitis 11/19/2014   Asthma 11/19/2014    History obtained from: chart review and patient.  Discussed the use of AI scribe software for clinical note transcription with the patient and/or guardian, who gave verbal consent to proceed.  Ariana Green is a 57 y.o. female presenting for a follow up visit. She was last seen in September 20223. At that time, lung function looked awesome. We continued with Dupixent every 2 weeks as well as albuterol as needed.  She has Qvar that she had during flares.  For her GERD, we continue with Nexium.  For her allergic rhinitis, we continue with Rhinocort as needed and Zyrtec as needed.  She did start her on Atrovent 1 spray per nostril every 8 hours as needed.  Her eczema is under good control with Elidel and Opzelura.  Since the last visit, she has done very well.  Asthma/Respiratory Symptom History: The patient reports a flare-up of asthma symptoms in August of the current year. She managed the exacerbation by adding Singulair to her regimen for a couple of weeks, doubling her Zyrtec dose, and  initiating Qvar. This self-management strategy successfully mitigated the symptoms, avoiding the need for prednisone or emergency care. The patient also mentions a past COVID-19 infection in June, which resulted in a prolonged cough.  She does not think she needs a refill of her Singulair, Qvar, or albuterol.  Allergic Rhinitis Symptom History: The  patient also reports some issues with allergies, particularly when traveling or exposed to certain fragrances. Despite these challenges, she has been able to manage her conditions effectively, avoiding severe flare-ups or the need for emergency care. She has been able to maintain an active lifestyle, including frequent travel.  However, this is very manageable.  She was on allergy shots for a period of time which do seem to have helped.  She was on maintenance of her allergy shots for approximately 18 months, with her last injection in January 2022.  Skin Symptom History: In addition to asthma, the patient has been managing skin conditions with Dupixent. She reports that she can tell when she is due for her next shot, as she experiences a slight increase in coughing and skin flare-ups a couple of days before the next dose is due. She also mentions occasional sores in her nose, which resolve with antibiotic treatment for her face.  She continues to see her dermatologist.  The patient's medications include Singulair, Zyrtec, Qvar, Dupixent, and an antibiotic for facial sores. She also uses Opzelura for skin conditions, which she reports as highly effective. She has not introduced any new medications recently.   She is doing a lot of traveling recently.  Her husband continues to work in [employer].  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  She is going to be traveling to [place] later this week.  Otherwise, there have been no changes to her past medical history, surgical history, family history, or social history.    Review of systems otherwise negative other than that mentioned in the HPI.    Objective:   Blood pressure 120/78, pulse 63, temperature 97.8 F (36.6 C), temperature source Temporal, resp. rate 17, SpO2 99%. There is no height or weight on file to calculate  BMI.    Physical Exam Vitals reviewed.  Constitutional:      Appearance: Normal appearance. She is well-developed.     Comments: Very pleasant and talkative.  HENT:     Head: Normocephalic and atraumatic.     Right Ear: Tympanic membrane, ear canal and external ear normal.     Left Ear: Tympanic membrane, ear canal and external ear normal.     Nose: No nasal deformity, septal deviation, mucosal edema or rhinorrhea.     Right Turbinates: Enlarged, swollen and pale.     Left Turbinates: Enlarged, swollen and pale.     Right Sinus: No maxillary sinus tenderness or frontal sinus tenderness.     Left Sinus: No maxillary sinus tenderness or frontal sinus tenderness.     Comments: No nasal lesions appreciated today. Turbinates are erythematous. No sores appreciated.     Mouth/Throat:     Mouth: Mucous membranes are not pale and not dry.     Pharynx: Uvula midline.  Eyes:     General: Lids are normal. No allergic shiner.       Right eye: No discharge.        Left eye: No discharge.     Conjunctiva/sclera: Conjunctivae normal.  Right eye: Right conjunctiva is not injected. No chemosis.    Left eye: Left conjunctiva is not injected. No chemosis.    Pupils: Pupils are equal, round, and reactive to light.  Cardiovascular:     Rate and Rhythm: Normal rate and regular rhythm.     Heart sounds: Normal heart sounds.  Pulmonary:     Effort: Pulmonary effort is normal. No tachypnea, accessory muscle usage or respiratory distress.     Breath sounds: Normal breath sounds. No wheezing, rhonchi or rales.  Chest:     Chest wall: No tenderness.  Lymphadenopathy:     Cervical: No cervical adenopathy.  Skin:    General: Skin is warm.     Capillary Refill: Capillary refill takes less than 2 seconds.     Coloration: Skin is not pale.     Findings: No abrasion, erythema, petechiae or rash. Rash is not papular, urticarial or vesicular.     Comments: Her skin looks quite good today. No urticarial or  contact dermatitis lesions noted.   Neurological:     Mental Status: She is alert.  Psychiatric:        Behavior: Behavior is cooperative.      Diagnostic studies:   FEV1 is 2.43 (103) and FVC is 3.27 (111%). FEV1/FVC ratio is 74%.      Malachi Bonds, MD  [facility]

## 2022-12-17 NOTE — Addendum Note (Signed)
Addended by: Philipp Deputy on: 12/17/2022 06:28 PM   Modules accepted: Orders

## 2022-12-25 DIAGNOSIS — Z0000 Encounter for general adult medical examination without abnormal findings: Secondary | ICD-10-CM | POA: Diagnosis not present

## 2022-12-25 DIAGNOSIS — Z79899 Other long term (current) drug therapy: Secondary | ICD-10-CM | POA: Diagnosis not present

## 2022-12-25 DIAGNOSIS — E7800 Pure hypercholesterolemia, unspecified: Secondary | ICD-10-CM | POA: Diagnosis not present

## 2022-12-25 DIAGNOSIS — K219 Gastro-esophageal reflux disease without esophagitis: Secondary | ICD-10-CM | POA: Diagnosis not present

## 2022-12-25 DIAGNOSIS — J45909 Unspecified asthma, uncomplicated: Secondary | ICD-10-CM | POA: Diagnosis not present

## 2022-12-25 DIAGNOSIS — E559 Vitamin D deficiency, unspecified: Secondary | ICD-10-CM | POA: Diagnosis not present

## 2023-01-01 ENCOUNTER — Other Ambulatory Visit: Payer: Self-pay | Admitting: Allergy & Immunology

## 2023-01-01 ENCOUNTER — Other Ambulatory Visit (HOSPITAL_COMMUNITY): Payer: Self-pay

## 2023-01-01 ENCOUNTER — Other Ambulatory Visit: Payer: Self-pay

## 2023-01-01 MED ORDER — DUPIXENT 300 MG/2ML ~~LOC~~ SOSY
300.0000 mg | PREFILLED_SYRINGE | SUBCUTANEOUS | 11 refills | Status: DC
  Filled 2023-01-01: qty 4, 28d supply, fill #0
  Filled 2023-02-03: qty 4, 28d supply, fill #1
  Filled 2023-03-06: qty 4, 28d supply, fill #2
  Filled 2023-04-07: qty 4, 28d supply, fill #3
  Filled 2023-05-06: qty 4, 28d supply, fill #4
  Filled 2023-06-03: qty 4, 28d supply, fill #5
  Filled 2023-07-02 (×2): qty 4, 28d supply, fill #6
  Filled 2023-07-25: qty 4, 28d supply, fill #7
  Filled 2023-08-21 – 2023-08-22 (×3): qty 4, 28d supply, fill #8
  Filled 2023-09-23: qty 4, 28d supply, fill #9
  Filled 2023-10-16: qty 4, 28d supply, fill #10
  Filled 2023-11-12 – 2023-11-13 (×2): qty 4, 28d supply, fill #11

## 2023-01-01 NOTE — Progress Notes (Signed)
Specialty Pharmacy Refill Coordination Note  Ariana Green is a 57 y.o. female contacted today regarding refills of specialty medication(s) Dupilumab   Patient requested [name] at [facility] at [place] date: 01/06/23   Medication will be filled on 01/06/23 pending a refill request.

## 2023-01-03 ENCOUNTER — Other Ambulatory Visit (HOSPITAL_COMMUNITY): Payer: Self-pay

## 2023-01-07 ENCOUNTER — Other Ambulatory Visit (HOSPITAL_COMMUNITY): Payer: Self-pay

## 2023-01-29 ENCOUNTER — Other Ambulatory Visit: Payer: Self-pay

## 2023-01-30 ENCOUNTER — Encounter: Payer: Self-pay | Admitting: Obstetrics and Gynecology

## 2023-02-03 ENCOUNTER — Other Ambulatory Visit: Payer: Self-pay

## 2023-02-03 NOTE — Progress Notes (Signed)
Specialty Pharmacy Refill Coordination Note  Ariana Green is a 57 y.o. female contacted today regarding refills of specialty medication(s) Dupilumab   Patient requested [name] at [facility] at [place] date: 02/04/23   Medication will be filled on 02/03/23.

## 2023-02-05 ENCOUNTER — Other Ambulatory Visit (HOSPITAL_COMMUNITY): Payer: Self-pay

## 2023-02-11 ENCOUNTER — Ambulatory Visit
Admission: RE | Admit: 2023-02-11 | Discharge: 2023-02-11 | Disposition: A | Discharge status: Home / Self Care | Payer: BC Managed Care – PPO | Source: Ambulatory Visit | Attending: Obstetrics and Gynecology | Admitting: Obstetrics and Gynecology

## 2023-02-11 DIAGNOSIS — Z803 Family history of malignant neoplasm of breast: Secondary | ICD-10-CM | POA: Diagnosis not present

## 2023-02-11 MED ORDER — GADOPICLENOL 0.5 MMOL/ML IV SOLN
6.0000 mL | Freq: Once | INTRAVENOUS | Status: AC | PRN
  Administered 2023-02-11: 6 mL via INTRAVENOUS

## 2023-03-06 ENCOUNTER — Other Ambulatory Visit: Payer: Self-pay

## 2023-03-06 NOTE — Progress Notes (Signed)
Specialty Pharmacy Refill Coordination Note  Ariana Green is a 57 y.o. female contacted today regarding refills of specialty medication(s) Dupilumab (Dupixent)   Patient requested [name] at [facility] at [place] date: 03/07/23   Medication will be filled on 03/06/23.

## 2023-03-06 NOTE — Progress Notes (Signed)
Specialty Pharmacy Ongoing Clinical Assessment Note  Ariana Green is a 57 y.o. female who is being followed by the specialty pharmacy service for RxSp Asthma/COPD   Patient's specialty medication(s) reviewed today: Dupilumab (Dupixent)   Missed doses in the last 4 weeks: 0   Patient/Caregiver did not have any additional questions or concerns.   Therapeutic benefit summary: Patient is achieving benefit   Adverse events/side effects summary: No adverse events/side effects   Patient's therapy is appropriate to: Continue    Goals Addressed             This Visit's Progress    Reduce disease symptoms including coughing and shortness of breath       Patient is on track. Patient will maintain adherence         Follow up:  6 months  Otto Herb Specialty Pharmacist

## 2023-03-10 ENCOUNTER — Other Ambulatory Visit: Payer: Self-pay | Admitting: Gastroenterology

## 2023-03-10 DIAGNOSIS — R1011 Right upper quadrant pain: Secondary | ICD-10-CM

## 2023-03-14 ENCOUNTER — Ambulatory Visit
Admission: RE | Admit: 2023-03-14 | Discharge: 2023-03-14 | Disposition: A | Discharge status: Home / Self Care | Payer: BC Managed Care – PPO | Source: Ambulatory Visit | Attending: Gastroenterology

## 2023-03-14 DIAGNOSIS — R1011 Right upper quadrant pain: Secondary | ICD-10-CM

## 2023-03-14 DIAGNOSIS — K7689 Other specified diseases of liver: Secondary | ICD-10-CM | POA: Diagnosis not present

## 2023-03-18 ENCOUNTER — Other Ambulatory Visit (HOSPITAL_COMMUNITY): Payer: Self-pay

## 2023-03-21 ENCOUNTER — Other Ambulatory Visit (HOSPITAL_COMMUNITY): Payer: Self-pay | Admitting: Gastroenterology

## 2023-03-21 DIAGNOSIS — R1010 Upper abdominal pain, unspecified: Secondary | ICD-10-CM

## 2023-03-24 DIAGNOSIS — Z1151 Encounter for screening for human papillomavirus (HPV): Secondary | ICD-10-CM | POA: Diagnosis not present

## 2023-03-24 DIAGNOSIS — Z124 Encounter for screening for malignant neoplasm of cervix: Secondary | ICD-10-CM | POA: Diagnosis not present

## 2023-03-24 DIAGNOSIS — Z01419 Encounter for gynecological examination (general) (routine) without abnormal findings: Secondary | ICD-10-CM | POA: Diagnosis not present

## 2023-03-24 DIAGNOSIS — Z6827 Body mass index (BMI) 27.0-27.9, adult: Secondary | ICD-10-CM | POA: Diagnosis not present

## 2023-03-26 ENCOUNTER — Encounter (HOSPITAL_COMMUNITY)
Admission: RE | Admit: 2023-03-26 | Discharge: 2023-03-26 | Disposition: A | Discharge status: Home / Self Care | Payer: BC Managed Care – PPO | Source: Ambulatory Visit | Attending: Gastroenterology | Admitting: Gastroenterology

## 2023-03-26 DIAGNOSIS — R1010 Upper abdominal pain, unspecified: Secondary | ICD-10-CM | POA: Insufficient documentation

## 2023-03-26 DIAGNOSIS — R1110 Vomiting, unspecified: Secondary | ICD-10-CM | POA: Diagnosis not present

## 2023-03-26 MED ORDER — TECHNETIUM TC 99M MEBROFENIN IV KIT
5.5000 | PACK | Freq: Once | INTRAVENOUS | Status: AC | PRN
  Administered 2023-03-26: 5.5 via INTRAVENOUS

## 2023-04-07 ENCOUNTER — Other Ambulatory Visit (HOSPITAL_COMMUNITY): Payer: Self-pay

## 2023-04-07 NOTE — Progress Notes (Signed)
Specialty Pharmacy Refill Coordination Note  Ariana Green is a 58 y.o. female contacted today regarding refills of specialty medication(s) Dupilumab (Dupixent)   Patient requested [name] at [facility] at [place] date: 04/08/23   Medication will be filled on 04/08/23.

## 2023-04-08 ENCOUNTER — Other Ambulatory Visit: Payer: Self-pay

## 2023-04-15 DIAGNOSIS — K2930 Chronic superficial gastritis without bleeding: Secondary | ICD-10-CM | POA: Diagnosis not present

## 2023-04-15 DIAGNOSIS — K449 Diaphragmatic hernia without obstruction or gangrene: Secondary | ICD-10-CM | POA: Diagnosis not present

## 2023-04-15 DIAGNOSIS — K317 Polyp of stomach and duodenum: Secondary | ICD-10-CM | POA: Diagnosis not present

## 2023-04-15 DIAGNOSIS — R1310 Dysphagia, unspecified: Secondary | ICD-10-CM | POA: Diagnosis not present

## 2023-04-15 DIAGNOSIS — K3189 Other diseases of stomach and duodenum: Secondary | ICD-10-CM | POA: Diagnosis not present

## 2023-04-15 DIAGNOSIS — R1010 Upper abdominal pain, unspecified: Secondary | ICD-10-CM | POA: Diagnosis not present

## 2023-04-15 DIAGNOSIS — D2930 Benign neoplasm of unspecified epididymis: Secondary | ICD-10-CM | POA: Diagnosis not present

## 2023-04-15 DIAGNOSIS — K2289 Other specified disease of esophagus: Secondary | ICD-10-CM | POA: Diagnosis not present

## 2023-05-06 ENCOUNTER — Other Ambulatory Visit: Payer: Self-pay

## 2023-05-06 NOTE — Progress Notes (Signed)
 Specialty Pharmacy Refill Coordination Note  Ariana Green is a 58 y.o. female contacted today regarding refills of specialty medication(s) Dupilumab (Dupixent)   Patient requested [name] at [facility] at [place] date: 05/08/23   Medication will be filled on 02.26.25.

## 2023-05-07 ENCOUNTER — Other Ambulatory Visit: Payer: Self-pay

## 2023-05-20 ENCOUNTER — Telehealth: Payer: Self-pay

## 2023-05-20 NOTE — Telephone Encounter (Signed)
*  Asthma/Allergy  Pharmacy Patient Advocate Encounter   Received notification from CoverMyMeds that prior authorization for Opzelura 1.5% cream  is required/requested.   Insurance verification completed.   The patient is insured through [facility] .   Per test claim: PA required; PA submitted to above mentioned insurance via CoverMyMeds Key/confirmation #/EOC [id number] Status is pending

## 2023-05-26 NOTE — Telephone Encounter (Signed)
 Still pending determination

## 2023-05-27 DIAGNOSIS — Z5181 Encounter for therapeutic drug level monitoring: Secondary | ICD-10-CM | POA: Diagnosis not present

## 2023-05-30 ENCOUNTER — Other Ambulatory Visit: Payer: Self-pay | Admitting: Allergy & Immunology

## 2023-05-30 ENCOUNTER — Other Ambulatory Visit (HOSPITAL_COMMUNITY): Payer: Self-pay

## 2023-05-30 NOTE — Telephone Encounter (Signed)
 Called plan to follow up- they need specifically the "continuation" form filled out and faxed- gave them my direct fax and am waiting to receive to complete.

## 2023-06-02 ENCOUNTER — Other Ambulatory Visit (HOSPITAL_COMMUNITY): Payer: Self-pay

## 2023-06-02 NOTE — Telephone Encounter (Signed)
 Continuation fax received and completed, faxed back to plan with chart notes.

## 2023-06-03 ENCOUNTER — Other Ambulatory Visit: Payer: Self-pay

## 2023-06-03 NOTE — Progress Notes (Signed)
 Specialty Pharmacy Refill Coordination Note  Ariana Green is a 58 y.o. female contacted today regarding refills of specialty medication(s) Dupilumab (Dupixent)   Patient requested (Patient-Rptd) Pickup at [facility] at [facility] date: (Patient-Rptd) 06/09/23   Medication will be filled on 03.28.25.

## 2023-06-06 ENCOUNTER — Other Ambulatory Visit: Payer: Self-pay

## 2023-06-11 ENCOUNTER — Other Ambulatory Visit (HOSPITAL_COMMUNITY): Payer: Self-pay

## 2023-06-11 NOTE — Telephone Encounter (Signed)
 Now covered-

## 2023-07-02 ENCOUNTER — Other Ambulatory Visit: Payer: Self-pay

## 2023-07-02 ENCOUNTER — Other Ambulatory Visit (HOSPITAL_COMMUNITY): Payer: Self-pay

## 2023-07-02 NOTE — Progress Notes (Signed)
 Specialty Pharmacy Refill Coordination Note  Ariana Green is a 58 y.o. female contacted today regarding refills of specialty medication(s) Dupixent .  Patient requested (Patient-Rptd) Pickup at [facility] at [facility] date: (Patient-Rptd) 07/04/23   Medication will be filled on 07/03/23.

## 2023-07-03 ENCOUNTER — Other Ambulatory Visit: Payer: Self-pay

## 2023-07-25 ENCOUNTER — Other Ambulatory Visit: Payer: Self-pay

## 2023-07-25 NOTE — Progress Notes (Signed)
 Specialty Pharmacy Refill Coordination Note  Ariana Green is a 58 y.o. female contacted today regarding refills of specialty medication(s) Dupilumab  (Dupixent )   Patient requested (Patient-Rptd) Pickup at [facility] at [facility] date: (Patient-Rptd) 07/28/23   Medication will be filled on 07/25/23.

## 2023-08-21 ENCOUNTER — Other Ambulatory Visit (HOSPITAL_COMMUNITY): Payer: Self-pay

## 2023-08-22 ENCOUNTER — Other Ambulatory Visit: Payer: Self-pay

## 2023-08-22 NOTE — Progress Notes (Signed)
 Specialty Pharmacy Refill Coordination Note  Ariana Green is a 58 y.o. female contacted today regarding refills of specialty medication(s) Dupilumab  (Dupixent )   Patient requested Cranston Dk at [facility] at [place] date: 08/23/23   Medication will be filled on 06.13.25.

## 2023-09-22 ENCOUNTER — Other Ambulatory Visit (HOSPITAL_COMMUNITY): Payer: Self-pay

## 2023-09-22 ENCOUNTER — Encounter (INDEPENDENT_AMBULATORY_CARE_PROVIDER_SITE_OTHER): Payer: Self-pay

## 2023-09-23 ENCOUNTER — Other Ambulatory Visit: Payer: Self-pay

## 2023-09-23 ENCOUNTER — Other Ambulatory Visit: Payer: Self-pay | Admitting: Pharmacy Technician

## 2023-09-23 NOTE — Progress Notes (Signed)
 Specialty Pharmacy Refill Coordination Note  Ariana Green is a 58 y.o. female contacted today regarding refills of specialty medication(s) Dupilumab  (Dupixent )   Patient requested Marylyn at [facility] at [place] date: 09/24/23   Medication will be filled on 09/23/23.

## 2023-10-16 ENCOUNTER — Other Ambulatory Visit: Payer: Self-pay

## 2023-10-16 ENCOUNTER — Other Ambulatory Visit: Payer: Self-pay | Admitting: Pharmacy Technician

## 2023-10-16 ENCOUNTER — Encounter (INDEPENDENT_AMBULATORY_CARE_PROVIDER_SITE_OTHER): Payer: Self-pay

## 2023-10-16 NOTE — Progress Notes (Signed)
 Specialty Pharmacy Refill Coordination Note  Ariana Green is a 58 y.o. female contacted today regarding refills of specialty medication(s) Dupilumab  (Dupixent )   Patient requested (Patient-Rptd) Pickup at [facility] at [facility] date: (Patient-Rptd) 10/21/23   Medication will be filled on 10/20/23.

## 2023-10-20 ENCOUNTER — Other Ambulatory Visit: Payer: Self-pay

## 2023-10-21 DIAGNOSIS — Z1231 Encounter for screening mammogram for malignant neoplasm of breast: Secondary | ICD-10-CM | POA: Diagnosis not present

## 2023-10-31 ENCOUNTER — Other Ambulatory Visit: Payer: Self-pay | Admitting: Obstetrics and Gynecology

## 2023-10-31 DIAGNOSIS — Z803 Family history of malignant neoplasm of breast: Secondary | ICD-10-CM

## 2023-11-12 ENCOUNTER — Other Ambulatory Visit: Payer: Self-pay

## 2023-11-12 ENCOUNTER — Encounter (INDEPENDENT_AMBULATORY_CARE_PROVIDER_SITE_OTHER): Payer: Self-pay

## 2023-11-13 ENCOUNTER — Other Ambulatory Visit: Payer: Self-pay | Admitting: Pharmacy Technician

## 2023-11-13 ENCOUNTER — Other Ambulatory Visit: Payer: Self-pay

## 2023-11-13 NOTE — Progress Notes (Signed)
 Specialty Pharmacy Refill Coordination Note  Ariana Green is a 58 y.o. female contacted today regarding refills of specialty medication(s) Dupilumab  (Dupixent )   Patient requested (Patient-Rptd) Pickup at [facility] at [facility] date: (Patient-Rptd) 11/19/23   Medication will be filled on 11/18/23.

## 2023-11-18 ENCOUNTER — Other Ambulatory Visit: Payer: Self-pay

## 2023-11-18 ENCOUNTER — Other Ambulatory Visit (HOSPITAL_COMMUNITY): Payer: Self-pay

## 2023-11-20 DIAGNOSIS — Z5181 Encounter for therapeutic drug level monitoring: Secondary | ICD-10-CM | POA: Diagnosis not present

## 2023-11-20 DIAGNOSIS — L578 Other skin changes due to chronic exposure to nonionizing radiation: Secondary | ICD-10-CM | POA: Diagnosis not present

## 2023-11-20 DIAGNOSIS — L700 Acne vulgaris: Secondary | ICD-10-CM | POA: Diagnosis not present

## 2023-11-20 DIAGNOSIS — L309 Dermatitis, unspecified: Secondary | ICD-10-CM | POA: Diagnosis not present

## 2023-11-20 DIAGNOSIS — L719 Rosacea, unspecified: Secondary | ICD-10-CM | POA: Diagnosis not present

## 2023-11-25 ENCOUNTER — Other Ambulatory Visit: Payer: Self-pay

## 2023-12-04 ENCOUNTER — Other Ambulatory Visit: Payer: Self-pay | Admitting: Allergy & Immunology

## 2023-12-04 ENCOUNTER — Other Ambulatory Visit: Payer: Self-pay

## 2023-12-04 MED ORDER — DUPIXENT 300 MG/2ML ~~LOC~~ SOSY
300.0000 mg | PREFILLED_SYRINGE | SUBCUTANEOUS | 11 refills | Status: AC
  Filled 2023-12-16: qty 4, 28d supply, fill #0
  Filled 2024-01-08: qty 4, 28d supply, fill #1
  Filled 2024-02-04: qty 4, 28d supply, fill #2
  Filled 2024-03-05 – 2024-03-08 (×4): qty 4, 28d supply, fill #3
  Filled 2024-03-30: qty 4, 28d supply, fill #4

## 2023-12-04 NOTE — Progress Notes (Signed)
 Specialty Pharmacy Refill Coordination Note  Ariana Green is a 58 y.o. female contacted today regarding refills of specialty medication(s) Dupilumab  (Dupixent )   Patient requested Marylyn at [facility] at [place] date: 12/17/23 (pending refill request patient has appt 10/7)   Medication will be filled on 12/16/23.

## 2023-12-04 NOTE — Progress Notes (Signed)
 Specialty Pharmacy Ongoing Clinical Assessment Note  Ariana Green is a 58 y.o. female who is being followed by the specialty pharmacy service for RxSp Asthma/COPD   Patient's specialty medication(s) reviewed today: Dupilumab  (Dupixent )   Missed doses in the last 4 weeks: 0   Patient/Caregiver did not have any additional questions or concerns.   Therapeutic benefit summary: Patient is achieving benefit   Adverse events/side effects summary: No adverse events/side effects   Patient's therapy is appropriate to: Continue    Goals Addressed             This Visit's Progress    Reduce disease symptoms including coughing and shortness of breath   On track    Patient is on track. Patient will maintain adherence         Follow up: 12 months  Netanya Yazdani M Khamryn Calderone Specialty Pharmacist

## 2023-12-16 ENCOUNTER — Other Ambulatory Visit: Payer: Self-pay

## 2023-12-16 ENCOUNTER — Encounter: Payer: Self-pay | Admitting: Allergy & Immunology

## 2023-12-16 ENCOUNTER — Ambulatory Visit: Payer: BC Managed Care – PPO | Admitting: Allergy & Immunology

## 2023-12-16 VITALS — BP 118/70 | HR 71 | Temp 97.8°F | Resp 16 | Ht 61.0 in | Wt 144.1 lb

## 2023-12-16 DIAGNOSIS — J3089 Other allergic rhinitis: Secondary | ICD-10-CM | POA: Diagnosis not present

## 2023-12-16 DIAGNOSIS — J302 Other seasonal allergic rhinitis: Secondary | ICD-10-CM

## 2023-12-16 DIAGNOSIS — J4530 Mild persistent asthma, uncomplicated: Secondary | ICD-10-CM | POA: Diagnosis not present

## 2023-12-16 DIAGNOSIS — L2089 Other atopic dermatitis: Secondary | ICD-10-CM | POA: Diagnosis not present

## 2023-12-16 DIAGNOSIS — L239 Allergic contact dermatitis, unspecified cause: Secondary | ICD-10-CM | POA: Diagnosis not present

## 2023-12-16 NOTE — Patient Instructions (Addendum)
 1. Mild persistent asthma, uncomplicated - Lung function looks AWESOME today, per usual.  - Daily controller medication(s): Dupixent  every 14 days  - Rescue medications: albuterol  4 puffs every 4-6 hours as needed - Changes during respiratory infections or worsening symptoms: add on Qvar  to 2 puffs twice daily for TWO WEEKS. - Asthma control goals:  * Full participation in all desired activities (may need albuterol  before activity) * Albuterol  use two time or less a week on average (not counting use with activity) * Cough interfering with sleep two time or less a month * Oral steroids no more than once a year * No hospitalizations  2. Gastroesophageal reflux disease - Continue with Nexium  daily.   3. Allergic rhinoconjunctivitis (horse, trees, indoor molds, outdoor molds, cat and dog) - s/p allergen immunotherapy - Continue with: Rhincort 1-2 sprays daily AS NEEDED and Zyrtec as needed - You can use an extra dose of the antihistamine, if needed, for breakthrough symptoms.  - Continue nasal saline rinses 1-2 times daily. - Continue with Bactroban daily as needed for the sores in your nose.   4. Eczema with overlying contact dermatitis  - Continue with alternating Elidel and Opzelura  twice daily as needed (safe to use over the entire body including the face and hands).   5. Return in about 1 year (around 12/15/2024). You can have the follow up appointment with Dr. Iva or a Nurse Practicioner (our Nurse Practitioners are excellent and always have Physician oversight!).    Please inform us  of any Emergency Department visits, hospitalizations, or changes in symptoms. Call us  before going to the ED for breathing or allergy  symptoms since we might be able to fit you in for a sick visit. Feel free to contact us  anytime with any questions, problems, or concerns.  It was a pleasure to see you again, as always!  Websites that have reliable patient information: 1. American Academy of  Asthma, Allergy , and Immunology: www.aaaai.org 2. Food Allergy  Research and Education (FARE): foodallergy.org 3. Mothers of Asthmatics: http://www.asthmacommunitynetwork.org 4. American College of Allergy , Asthma, and Immunology: www.acaai.org      "Like" us  on Facebook and Instagram for our latest updates!      A healthy democracy works best when [employer] participate! Make sure you are registered to vote! If you have moved or changed any of your contact information, you will need to get this updated before voting! Scan the QR codes below to learn more!

## 2023-12-16 NOTE — Progress Notes (Signed)
 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 Mild persistent asthma, uncomplicated - Lung function looks AWESOME today, per usual.  - Daily controller medication(s): Dupixent  every 14 days  - Rescue medications: albuterol  4 puffs every 4-6 hours as needed - Changes during respiratory infections or worsening symptoms: add on Qvar  to 2 puffs twice daily for TWO WEEKS. - Asthma control goals:  * Full participation in all desired activities (may need albuterol  before activity) * Albuterol  use two time or less a week on average (not counting use with activity) * Cough interfering with sleep two time or less a month * Oral steroids no more than once a year * No hospitalizations  2. Gastroesophageal reflux disease - Continue with Nexium  daily.   3. Allergic rhinoconjunctivitis (horse, trees, indoor molds, outdoor molds, cat and dog) - s/p allergen immunotherapy - Continue with: Rhincort 1-2 sprays daily AS NEEDED and Zyrtec as needed - You can use an extra dose of the antihistamine, if needed, for breakthrough symptoms.  - Continue nasal saline rinses 1-2 times daily. - Continue with Bactroban daily as needed for the sores in your nose.   4. Eczema with overlying contact dermatitis  - Continue with alternating Elidel and Opzelura  twice daily as needed (safe to use over the entire body including the face and hands).   5.  Return in about 1 year (around 12/15/2024). You can have the follow up appointment with Dr. Iva or a Nurse Practicioner (our Nurse Practitioners are excellent and always have Physician oversight!).    Subjective:   Ariana Green is a 58 y.o. female presenting today for follow up of  Chief Complaint  Patient presents with   Follow-up   Asthma    Ariana Green has a history of the following: Patient Active Problem List   Diagnosis Date Noted   Allergic contact dermatitis 08/02/2019   Gastroesophageal reflux disease 01/22/2017   Seasonal and perennial allergic rhinitis 01/22/2017   Allergic rhinoconjunctivitis 11/19/2014   Asthma 11/19/2014    History obtained from: chart review and patient.  Discussed the use of AI scribe software for clinical note transcription with the patient and/or guardian, who gave verbal consent to proceed.  Ariana Green is a 58 y.o. female presenting for a follow up visit.  She was last seen in October 2024.  At that time, lung function looked amazing.  We continue with Dupixent  every 14 days.  She has Qvar  that she adds during flares.  GERD was controlled with Nexium .  We continue with Rhinocort as well as Zyrtec for her allergies.  She had been on allergy  shots with improvement in symptoms.  She also continue with alternating Elidel and Opzelura  twice daily as needed.  Since last visit, she has done very well.   Asthma/Respiratory Symptom History: She describes Dupixent  as a 'Secretary/administrator' for her asthma. She rarely uses her albuterol  inhaler, only once or twice a year, and reports that she has not been using Qvar . She has a  history of pneumonia and pertussis, which led her to seek allergy  treatment. She has been a lifelong asthma patient. She is currently taking Dupixent , which she receives for free through a copay card, although there was one month where she had to pay $150.   Allergic Rhinitis Symptom History: She previously had to stop allergy  shots  due to skin reactions. She also uses Zyrtec, which she doubled last week due to allergy  symptoms, and Rhinocort, which she started using again in October.  Her symptoms are under good control following allergy  shots that she did receive.  Skin Symptom History: Her eczema is managed with Elidel and Opzelura , which she rotates based on effectiveness. She has experienced issues with pharmacy refills but has a sufficient stockpile of medications. She has a history of being allergic to various chemicals, as determined by patch testing.  She has done very well from a skin perspective with her Dupixent .  Her kids are doing well.  One of her sons is in his second year of dental school.  Otherwise, there have been no changes to her past medical history, surgical history, family history, or social history.    Review of systems otherwise negative other than that mentioned in the HPI.    Objective:   Blood pressure 118/70, pulse 71, temperature 97.8 F (36.6 C), temperature source Temporal, resp. rate 16, height 5' 1 (1.549 m), weight 144 lb 1.6 oz (65.4 kg), SpO2 97%. Body mass index is 27.23 kg/m.    Physical Exam Vitals reviewed.  Constitutional:      Appearance: Normal appearance. She is well-developed.     Comments: Very pleasant and talkative.  HENT:     Head: Normocephalic and atraumatic.     Right Ear: Tympanic membrane, ear canal and external ear normal.     Left Ear: Tympanic membrane, ear canal and external ear normal.     Nose: No nasal deformity, septal deviation, mucosal edema or rhinorrhea.     Right Turbinates: Enlarged, swollen and pale.     Left Turbinates: Enlarged, swollen and pale.     Right Sinus: No maxillary sinus tenderness or frontal sinus tenderness.     Left Sinus: No maxillary sinus tenderness or frontal sinus tenderness.     Comments: No nasal lesions appreciated today. Turbinates are erythematous.  Minimal rhinorrhea.    Mouth/Throat:     Lips: Pink.      Mouth: Mucous membranes are moist. Mucous membranes are not pale and not dry.     Pharynx: Uvula midline.     Comments: Mild cobblestoning. Eyes:     General: Lids are normal. Allergic shiner present.        Right eye: No discharge.        Left eye: No discharge.     Conjunctiva/sclera: Conjunctivae normal.     Right eye: Right conjunctiva is not injected. No chemosis.    Left eye: Left conjunctiva is not injected. No chemosis.    Pupils: Pupils are equal, round, and reactive to light.  Cardiovascular:     Rate and Rhythm: Normal rate and regular rhythm.     Heart sounds: Normal heart sounds.  Pulmonary:     Effort: Pulmonary effort is normal. No tachypnea, accessory muscle usage or respiratory distress.     Breath sounds: Normal breath sounds. No wheezing, rhonchi or rales.  Chest:     Chest wall: No tenderness.  Lymphadenopathy:     Cervical: No cervical adenopathy.  Skin:    General:  Skin is warm.     Capillary Refill: Capillary refill takes less than 2 seconds.     Coloration: Skin is not pale.     Findings: No abrasion, erythema, petechiae or rash. Rash is not papular, urticarial or vesicular.     Comments: Her skin looks quite good today. No urticarial or contact dermatitis lesions noted.   Neurological:     Mental Status: She is alert.  Psychiatric:        Behavior: Behavior is cooperative.      Diagnostic studies:    Spirometry: results normal (FEV1: 2.55/110%, FVC: 3.28/112%, FEV1/FVC: 78%).    Spirometry consistent with normal pattern.   Allergy  Studies: none      Marty Shaggy, MD  [facility] 

## 2023-12-31 DIAGNOSIS — M159 Polyosteoarthritis, unspecified: Secondary | ICD-10-CM | POA: Diagnosis not present

## 2023-12-31 DIAGNOSIS — Z79899 Other long term (current) drug therapy: Secondary | ICD-10-CM | POA: Diagnosis not present

## 2023-12-31 DIAGNOSIS — K219 Gastro-esophageal reflux disease without esophagitis: Secondary | ICD-10-CM | POA: Diagnosis not present

## 2023-12-31 DIAGNOSIS — E7800 Pure hypercholesterolemia, unspecified: Secondary | ICD-10-CM | POA: Diagnosis not present

## 2023-12-31 DIAGNOSIS — J45909 Unspecified asthma, uncomplicated: Secondary | ICD-10-CM | POA: Diagnosis not present

## 2023-12-31 DIAGNOSIS — Z23 Encounter for immunization: Secondary | ICD-10-CM | POA: Diagnosis not present

## 2023-12-31 DIAGNOSIS — E559 Vitamin D deficiency, unspecified: Secondary | ICD-10-CM | POA: Diagnosis not present

## 2023-12-31 DIAGNOSIS — Z0000 Encounter for general adult medical examination without abnormal findings: Secondary | ICD-10-CM | POA: Diagnosis not present

## 2024-01-08 ENCOUNTER — Encounter (HOSPITAL_COMMUNITY): Payer: Self-pay

## 2024-01-08 ENCOUNTER — Other Ambulatory Visit: Payer: Self-pay

## 2024-01-09 ENCOUNTER — Encounter (INDEPENDENT_AMBULATORY_CARE_PROVIDER_SITE_OTHER): Payer: Self-pay

## 2024-01-09 ENCOUNTER — Other Ambulatory Visit: Payer: Self-pay

## 2024-01-09 ENCOUNTER — Other Ambulatory Visit (HOSPITAL_COMMUNITY): Payer: Self-pay

## 2024-01-09 NOTE — Progress Notes (Signed)
 Specialty Pharmacy Refill Coordination Note  Ariana Green is a 58 y.o. female contacted today regarding refills of specialty medication(s) Dupilumab  (Dupixent )   Patient requested Marylyn at [facility] at [place] date: 01/17/24   Medication will be filled on: 01/16/24

## 2024-01-13 ENCOUNTER — Other Ambulatory Visit (HOSPITAL_COMMUNITY): Payer: Self-pay

## 2024-01-16 ENCOUNTER — Other Ambulatory Visit: Payer: Self-pay

## 2024-02-04 ENCOUNTER — Other Ambulatory Visit (HOSPITAL_COMMUNITY): Payer: Self-pay

## 2024-02-04 ENCOUNTER — Other Ambulatory Visit: Payer: Self-pay

## 2024-02-04 NOTE — Progress Notes (Signed)
 Specialty Pharmacy Refill Coordination Note  Ariana Green is a 58 y.o. female contacted today regarding refills of specialty medication(s) Dupilumab  (Dupixent )   Patient requested Marylyn at [facility] at [place] date: 02/06/24   Medication will be filled on: 02/04/24

## 2024-02-24 ENCOUNTER — Other Ambulatory Visit: Payer: Self-pay | Admitting: Allergy & Immunology

## 2024-02-27 ENCOUNTER — Other Ambulatory Visit (HOSPITAL_COMMUNITY): Payer: Self-pay

## 2024-03-05 ENCOUNTER — Other Ambulatory Visit: Payer: Self-pay

## 2024-03-08 ENCOUNTER — Other Ambulatory Visit: Payer: Self-pay

## 2024-03-08 ENCOUNTER — Other Ambulatory Visit (HOSPITAL_COMMUNITY): Payer: Self-pay

## 2024-03-08 NOTE — Progress Notes (Signed)
 Specialty Pharmacy Refill Coordination Note  Ariana Green is a 58 y.o. female contacted today regarding refills of specialty medication(s) Dupilumab  (Dupixent )   Patient requested (Patient-Rptd) Pickup at [facility] at [facility] date: (Patient-Rptd) 03/09/24   Medication will be filled on: 03/08/24

## 2024-03-27 ENCOUNTER — Other Ambulatory Visit (HOSPITAL_COMMUNITY): Payer: Self-pay

## 2024-03-28 ENCOUNTER — Other Ambulatory Visit (HOSPITAL_COMMUNITY): Payer: Self-pay

## 2024-03-30 ENCOUNTER — Other Ambulatory Visit: Payer: Self-pay

## 2024-03-30 NOTE — Progress Notes (Signed)
 Specialty Pharmacy Refill Coordination Note  Ariana Green is a 59 y.o. female contacted today regarding refills of specialty medication(s) Dupilumab  (Dupixent )   Patient requested (Patient-Rptd) Pickup at [facility] at [facility] date: (Patient-Rptd) 04/05/24   Medication will be filled on: 04/02/24

## 2024-04-02 ENCOUNTER — Other Ambulatory Visit: Payer: Self-pay

## 2024-04-05 ENCOUNTER — Other Ambulatory Visit: Payer: Self-pay

## 2024-04-07 ENCOUNTER — Other Ambulatory Visit (HOSPITAL_COMMUNITY): Payer: Self-pay

## 2024-04-21 ENCOUNTER — Other Ambulatory Visit

## 2024-12-14 ENCOUNTER — Ambulatory Visit: Admitting: Allergy & Immunology
